# Patient Record
Sex: Female | Born: 1965
Health system: Southern US, Community
[De-identification: ages and names within clinical notes are randomized; demographics above are authoritative.]

## PROBLEM LIST (undated history)

## (undated) DIAGNOSIS — J45909 Unspecified asthma, uncomplicated: Secondary | ICD-10-CM

## (undated) DIAGNOSIS — I4892 Unspecified atrial flutter: Secondary | ICD-10-CM

## (undated) HISTORY — PX: ESOPHAGOGASTRODUODENOSCOPY: SHX1529

## (undated) HISTORY — PX: MOUTH SURGERY: SHX715

## (undated) HISTORY — PX: COLONOSCOPY: SHX174

## (undated) HISTORY — DX: Unspecified atrial flutter: I48.92

## (undated) HISTORY — DX: Unspecified asthma, uncomplicated: J45.909

---

## 2000-04-04 ENCOUNTER — Other Ambulatory Visit: Admission: RE | Admit: 2000-04-04 | Discharge: 2000-04-04 | Payer: Self-pay | Admitting: Obstetrics and Gynecology

## 2001-04-18 ENCOUNTER — Ambulatory Visit (HOSPITAL_COMMUNITY): Admission: RE | Admit: 2001-04-18 | Discharge: 2001-04-18 | Payer: Self-pay | Admitting: Internal Medicine

## 2004-03-03 ENCOUNTER — Other Ambulatory Visit: Admission: RE | Admit: 2004-03-03 | Discharge: 2004-03-03 | Payer: Self-pay | Admitting: Obstetrics and Gynecology

## 2004-08-31 ENCOUNTER — Inpatient Hospital Stay (HOSPITAL_COMMUNITY): Admission: AD | Admit: 2004-08-31 | Discharge: 2004-09-04 | Payer: Self-pay | Admitting: Obstetrics and Gynecology

## 2004-10-08 ENCOUNTER — Other Ambulatory Visit: Admission: RE | Admit: 2004-10-08 | Discharge: 2004-10-08 | Payer: Self-pay | Admitting: Obstetrics and Gynecology

## 2004-10-09 ENCOUNTER — Encounter: Admission: RE | Admit: 2004-10-09 | Discharge: 2004-11-08 | Payer: Self-pay | Admitting: Obstetrics and Gynecology

## 2004-11-09 ENCOUNTER — Encounter: Admission: RE | Admit: 2004-11-09 | Discharge: 2004-12-09 | Payer: Self-pay | Admitting: Obstetrics and Gynecology

## 2005-01-09 ENCOUNTER — Encounter: Admission: RE | Admit: 2005-01-09 | Discharge: 2005-02-08 | Payer: Self-pay | Admitting: Obstetrics and Gynecology

## 2005-02-09 ENCOUNTER — Encounter: Admission: RE | Admit: 2005-02-09 | Discharge: 2005-03-10 | Payer: Self-pay | Admitting: Obstetrics and Gynecology

## 2005-03-11 ENCOUNTER — Encounter: Admission: RE | Admit: 2005-03-11 | Discharge: 2005-04-10 | Payer: Self-pay | Admitting: Obstetrics and Gynecology

## 2005-04-11 ENCOUNTER — Encounter: Admission: RE | Admit: 2005-04-11 | Discharge: 2005-05-10 | Payer: Self-pay | Admitting: Obstetrics and Gynecology

## 2005-05-11 ENCOUNTER — Encounter: Admission: RE | Admit: 2005-05-11 | Discharge: 2005-06-10 | Payer: Self-pay | Admitting: Obstetrics and Gynecology

## 2005-06-11 ENCOUNTER — Encounter: Admission: RE | Admit: 2005-06-11 | Discharge: 2005-07-11 | Payer: Self-pay | Admitting: Obstetrics and Gynecology

## 2005-07-12 ENCOUNTER — Encounter: Admission: RE | Admit: 2005-07-12 | Discharge: 2005-08-08 | Payer: Self-pay | Admitting: Obstetrics and Gynecology

## 2005-08-09 ENCOUNTER — Encounter: Admission: RE | Admit: 2005-08-09 | Discharge: 2005-09-08 | Payer: Self-pay | Admitting: Obstetrics and Gynecology

## 2005-09-09 ENCOUNTER — Encounter: Admission: RE | Admit: 2005-09-09 | Discharge: 2005-10-08 | Payer: Self-pay | Admitting: Obstetrics and Gynecology

## 2005-10-09 ENCOUNTER — Encounter: Admission: RE | Admit: 2005-10-09 | Discharge: 2005-11-08 | Payer: Self-pay | Admitting: Obstetrics and Gynecology

## 2005-11-09 ENCOUNTER — Encounter: Admission: RE | Admit: 2005-11-09 | Discharge: 2005-11-27 | Payer: Self-pay | Admitting: Obstetrics and Gynecology

## 2013-03-01 ENCOUNTER — Other Ambulatory Visit: Payer: Self-pay

## 2013-04-11 ENCOUNTER — Other Ambulatory Visit: Payer: Self-pay

## 2013-04-11 DIAGNOSIS — Z1231 Encounter for screening mammogram for malignant neoplasm of breast: Secondary | ICD-10-CM

## 2013-04-11 DIAGNOSIS — Z803 Family history of malignant neoplasm of breast: Secondary | ICD-10-CM

## 2013-05-03 ENCOUNTER — Other Ambulatory Visit (HOSPITAL_COMMUNITY)
Admission: RE | Admit: 2013-05-03 | Discharge: 2013-05-03 | Disposition: A | Discharge status: Home / Self Care | Payer: BC Managed Care – PPO | Source: Ambulatory Visit | Attending: Family Medicine | Admitting: Family Medicine

## 2013-05-03 ENCOUNTER — Other Ambulatory Visit: Payer: Self-pay | Admitting: Family Medicine

## 2013-05-03 DIAGNOSIS — Z1151 Encounter for screening for human papillomavirus (HPV): Secondary | ICD-10-CM | POA: Insufficient documentation

## 2013-05-03 DIAGNOSIS — Z124 Encounter for screening for malignant neoplasm of cervix: Secondary | ICD-10-CM | POA: Insufficient documentation

## 2013-05-16 ENCOUNTER — Ambulatory Visit
Admission: RE | Admit: 2013-05-16 | Discharge: 2013-05-16 | Disposition: A | Discharge status: Home / Self Care | Payer: BC Managed Care – PPO | Source: Ambulatory Visit

## 2013-05-16 DIAGNOSIS — Z803 Family history of malignant neoplasm of breast: Secondary | ICD-10-CM

## 2013-05-16 DIAGNOSIS — Z1231 Encounter for screening mammogram for malignant neoplasm of breast: Secondary | ICD-10-CM

## 2014-09-20 ENCOUNTER — Other Ambulatory Visit: Payer: Self-pay

## 2014-09-20 DIAGNOSIS — Z1231 Encounter for screening mammogram for malignant neoplasm of breast: Secondary | ICD-10-CM

## 2014-09-27 ENCOUNTER — Ambulatory Visit: Payer: Self-pay

## 2014-10-02 ENCOUNTER — Ambulatory Visit
Admission: RE | Admit: 2014-10-02 | Discharge: 2014-10-02 | Disposition: A | Discharge status: Home / Self Care | Payer: BLUE CROSS/BLUE SHIELD | Source: Ambulatory Visit

## 2014-10-02 DIAGNOSIS — Z1231 Encounter for screening mammogram for malignant neoplasm of breast: Secondary | ICD-10-CM

## 2015-12-18 MED FILL — FLUoxetine HCL 40 MG CAPS: 40 | 90 days supply | Qty: 90 | Fill #0

## 2016-03-31 DIAGNOSIS — Z0120 Encounter for dental examination and cleaning without abnormal findings: Secondary | ICD-10-CM | POA: Diagnosis not present

## 2016-05-14 DIAGNOSIS — F329 Major depressive disorder, single episode, unspecified: Secondary | ICD-10-CM | POA: Diagnosis not present

## 2016-05-14 MED FILL — FLUoxetine HCL 40 MG CAPS: 40 | 90 days supply | Qty: 90 | Fill #0

## 2016-05-20 DIAGNOSIS — M545 Low back pain: Secondary | ICD-10-CM | POA: Diagnosis not present

## 2016-05-20 DIAGNOSIS — G8929 Other chronic pain: Secondary | ICD-10-CM | POA: Diagnosis not present

## 2016-07-22 DIAGNOSIS — J069 Acute upper respiratory infection, unspecified: Secondary | ICD-10-CM | POA: Diagnosis not present

## 2016-07-22 MED FILL — AZITHROMYCIN 250 MG TABLET: 250 | 5 days supply | Qty: 6 | Fill #0

## 2016-07-22 MED FILL — PROMETHAZINE-CODEINE SYRUP: 6.25-10 | 6 days supply | Qty: 120 | Fill #0

## 2016-11-11 MED FILL — FLUoxetine HCL 40 MG CAPS: 40 | 90 days supply | Qty: 90 | Fill #1

## 2017-11-11 DIAGNOSIS — Z0000 Encounter for general adult medical examination without abnormal findings: Secondary | ICD-10-CM | POA: Diagnosis not present

## 2017-11-15 DIAGNOSIS — F329 Major depressive disorder, single episode, unspecified: Secondary | ICD-10-CM | POA: Diagnosis not present

## 2017-11-15 DIAGNOSIS — Z0000 Encounter for general adult medical examination without abnormal findings: Secondary | ICD-10-CM | POA: Diagnosis not present

## 2017-11-15 MED FILL — FLUoxetine HCL 40 MG CAPS: 40 | 90 days supply | Qty: 90 | Fill #0

## 2018-02-27 MED FILL — FLUoxetine HCL 40 MG CAPS: 40 | 90 days supply | Qty: 90 | Fill #1

## 2018-07-20 MED FILL — FLUoxetine HCL 40 MG CAPS: 40 | 90 days supply | Qty: 90 | Fill #0

## 2018-08-10 DIAGNOSIS — H524 Presbyopia: Secondary | ICD-10-CM | POA: Diagnosis not present

## 2018-11-21 DIAGNOSIS — Z1211 Encounter for screening for malignant neoplasm of colon: Secondary | ICD-10-CM | POA: Diagnosis not present

## 2018-11-21 DIAGNOSIS — F329 Major depressive disorder, single episode, unspecified: Secondary | ICD-10-CM | POA: Diagnosis not present

## 2018-11-21 DIAGNOSIS — Z0000 Encounter for general adult medical examination without abnormal findings: Secondary | ICD-10-CM | POA: Diagnosis not present

## 2018-11-21 MED FILL — FLUoxetine HCL 40 MG CAPS: 40 | 90 days supply | Qty: 90 | Fill #0

## 2018-11-23 MED FILL — ALBUTEROL SULFATE HFA 108 (: 108 (90 BAS | 33 days supply | Qty: 18 | Fill #0

## 2019-01-22 DIAGNOSIS — Z1211 Encounter for screening for malignant neoplasm of colon: Secondary | ICD-10-CM | POA: Diagnosis not present

## 2019-03-21 ENCOUNTER — Other Ambulatory Visit: Payer: Self-pay | Admitting: Family Medicine

## 2019-03-21 DIAGNOSIS — Z1231 Encounter for screening mammogram for malignant neoplasm of breast: Secondary | ICD-10-CM

## 2019-03-28 ENCOUNTER — Other Ambulatory Visit: Payer: Self-pay

## 2019-03-28 ENCOUNTER — Ambulatory Visit (INDEPENDENT_AMBULATORY_CARE_PROVIDER_SITE_OTHER): Payer: 59

## 2019-03-28 DIAGNOSIS — Z1231 Encounter for screening mammogram for malignant neoplasm of breast: Secondary | ICD-10-CM

## 2019-04-02 ENCOUNTER — Telehealth: Payer: 59 | Admitting: Physician Assistant

## 2019-04-02 DIAGNOSIS — R059 Cough, unspecified: Secondary | ICD-10-CM

## 2019-04-02 DIAGNOSIS — R05 Cough: Secondary | ICD-10-CM

## 2019-04-02 DIAGNOSIS — R062 Wheezing: Secondary | ICD-10-CM

## 2019-04-02 NOTE — Progress Notes (Signed)
Hi Kathy Ramsey, I am sorry you are not feeling well.  I am concerned about the progression of your wheezing.  I would feel more comfortable if you were seen by a medical provider so they can listen to you breathing and check a chest x-ray if necessary.    Based on what you shared with me, I feel your condition warrants further evaluation and I recommend that you be seen for a face to face office visit.  NOTE: If you entered your credit card information for this eVisit, you will not be charged. You may see a "hold" on your card for the $35 but that hold will drop off and you will not have a charge processed.  If you are having a true medical emergency please call 911.     For an urgent face to face visit, [facility] has four urgent care centers for your convenience:    NEW:  [facility] [address], [place] [address] .  Monday - Friday 10 am - 6 pm    . [facility]    [phone]                  Get Driving Directions  [id number] [place], [place] [address] . 10 am to 8 pm Monday-Friday . 12 pm to 8 pm Saturday-Sunday   . [facility] at [place]                  Get Driving Directions  [id number] [place], [place], [place] [address] . 8 am to 8 pm Monday-Friday . 9 am to 6 pm Saturday . 11 am to 6 pm Sunday     . [facility] at [place]                  Get Driving Directions   [address], [place] [address] . 8 am to 8 pm Monday-Friday . 8 am to 4 pm Saturday-Sunday    . [facility] at [place]                    Get Driving Directions  [id number]  [address], [place], [place] [address]  . Monday-Friday, 12 PM to 6 PM    Your e-visit answers were reviewed by a board certified advanced clinical practitioner to complete your personal care plan.  Thank you  for using e-Visits.

## 2019-04-13 MED FILL — ALBUTEROL SULFATE HFA 108 (: 108 (90 BAS | 25 days supply | Qty: 18 | Fill #0

## 2019-04-13 MED FILL — METHYLPREDNISOLONE 4 MG TBP: 4 | 6 days supply | Qty: 21 | Fill #0

## 2019-04-13 MED FILL — MONTELUKAST SOD 10 MG TAB: 10 | 30 days supply | Qty: 30 | Fill #0

## 2019-04-13 MED FILL — ADVAIR 250/50 DISKUS: 250-50 | 30 days supply | Qty: 60 | Fill #0

## 2019-04-18 DIAGNOSIS — R05 Cough: Secondary | ICD-10-CM | POA: Diagnosis not present

## 2019-04-18 DIAGNOSIS — M940 Chondrocostal junction syndrome [Tietze]: Secondary | ICD-10-CM | POA: Diagnosis not present

## 2019-04-19 DIAGNOSIS — R05 Cough: Secondary | ICD-10-CM | POA: Diagnosis not present

## 2019-04-20 ENCOUNTER — Ambulatory Visit (INDEPENDENT_AMBULATORY_CARE_PROVIDER_SITE_OTHER): Payer: 59 | Admitting: Internal Medicine

## 2019-04-20 ENCOUNTER — Other Ambulatory Visit: Payer: Self-pay

## 2019-04-20 ENCOUNTER — Encounter: Payer: Self-pay | Admitting: Internal Medicine

## 2019-04-20 VITALS — BP 110/68 | HR 72 | Temp 98.1°F | Ht 68.0 in | Wt 142.2 lb

## 2019-04-20 DIAGNOSIS — J4540 Moderate persistent asthma, uncomplicated: Secondary | ICD-10-CM

## 2019-04-20 MED ORDER — ALBUTEROL SULFATE HFA 108 (90 BASE) MCG/ACT IN AERS: 1.0000 | INHALATION_SPRAY | RESPIRATORY_TRACT | 11 refills | Status: DC | PRN

## 2019-04-20 MED ORDER — FLUTICASONE-SALMETEROL 250-50 MCG/DOSE IN AEPB: 1.0000 | INHALATION_SPRAY | Freq: Two times a day (BID) | RESPIRATORY_TRACT | 11 refills | Status: DC

## 2019-04-20 NOTE — Patient Instructions (Addendum)
The patient should have follow up scheduled with myself in 2 months.   Prior to next visit patient should have a full set of PFTs including - next available at [employer].   Spirometry with bronchodilator if obstructed Lung Volumes DLCO FeNO   Ok to take montelukast.  Start taking advair 2 puffs twice a day.   By learning about asthma and how it can be controlled, you take an important step toward managing this disease. Work closely with your asthma care team to learn all you can about your asthma, how to avoid triggers, what your medications do, and how to take them correctly. With proper care, you can live free of asthma symptoms and maintain a normal, healthy lifestyle.   What is asthma? Asthma is a chronic disease that affects the airways of the lungs. During normal breathing, the bands of muscle that surround the airways are relaxed and air moves freely. During an asthma episode or "attack," there are three main changes that stop air from moving easily through the airways:  The bands of muscle that surround the airways tighten and make the airways narrow. This tightening is called bronchospasm.   The lining of the airways becomes swollen or inflamed.   The cells that line the airways produce more mucus, which is thicker than normal and clogs the airways.  These three factors - bronchospasm, inflammation, and mucus production - cause symptoms such as difficulty breathing, wheezing, and coughing.  What are the most common symptoms of asthma? Asthma symptoms are not the same for everyone. They can even change from episode to episode in the same person. Also, you may have only one symptom of asthma, such as cough, but another person may have all the symptoms of asthma. It is important to know all the symptoms of asthma and to be aware that your asthma can present in any of these ways at any time. The most common symptoms include: . Coughing, especially at night  . Shortness of breath   . Wheezing  . Chest tightness, pain, or pressure   Who is affected by asthma? Asthma affects 22 million Americans; about 6 million of these are children under age 50. People who have a family history of asthma have an increased risk of developing the disease. Asthma is also more common in people who have allergies or who are exposed to tobacco smoke. However, anyone can develop asthma at any time. Some people may have asthma all of their lives, while others may develop it as adults.  What causes asthma? The airways in a person with asthma are very sensitive and react to many things, or "triggers." Contact with these triggers causes asthma symptoms. One of the most important parts of asthma control is to identify your triggers and then avoid them when possible. The only trigger you do not want to avoid is exercise. Pre-treatment with medicines before exercise can allow you to stay active yet avoid asthma symptoms. Common asthma triggers include: 1. Infections (colds, viruses, flu, sinus infections)  2. Exercise  3. Weather (changes in temperature and/or humidity, cold air)  4. Tobacco smoke  5. Allergens (dust mites, pollens, pets, mold spores, cockroaches, and sometimes foods)  6. Irritants (strong odors from cleaning products, perfume, wood smoke, air pollution)  7. Strong emotions such as crying or laughing hard  8. Some medications   How is asthma diagnosed? To diagnose asthma, your doctor will first review your medical history, family history, and symptoms. Your doctor will want to  know any past history of breathing problems you may have had, as well as a family history of asthma, allergies, eczema (a bumpy, itchy skin rash caused by allergies), or other lung disease. It is important that you describe your symptoms in detail (cough, wheeze, shortness of breath, chest tightness), including when and how often they occur. The doctor will perform a physical examination and listen to your heart  and lungs. He or she may also order breathing tests, allergy tests, blood tests, and chest and sinus X-rays. The tests will find out if you do have asthma and if there are any other conditions that are contributing factors.  How is asthma treated? Asthma can be controlled, but not cured. It is not normal to have frequent symptoms, trouble sleeping, or trouble completing tasks. Appropriate asthma care will prevent symptoms and visits to the emergency room and hospital. Asthma medicines are one of the mainstays of asthma treatment. The drugs used to treat asthma are explained below.  Anti-inflammatories: These are the most important drugs for most people with asthma. Anti-inflammatory drugs reduce swelling and mucus production in the airways. As a result, airways are less sensitive and less likely to react to triggers. These medications need to be taken daily and may need to be taken for several weeks before they begin to control asthma. Anti-inflammatory medicines lead to fewer symptoms, better airflow, less sensitive airways, less airway damage, and fewer asthma attacks. If taken every day, they CONTROL or prevent asthma symptoms.   Bronchodilators: These drugs relax the muscle bands that tighten around the airways. This action opens the airways, letting more air in and out of the lungs and improving breathing. Bronchodilators also help clear mucus from the lungs. As the airways open, the mucus moves more freely and can be coughed out more easily. In short-acting forms, bronchodilators RELIEVE or stop asthma symptoms by quickly opening the airways and are very helpful during an asthma episode. In long-acting forms, bronchodilators provide CONTROL of asthma symptoms and prevent asthma episodes.  Asthma drugs can be taken in a variety of ways. Inhaling the medications by using a metered dose inhaler, dry powder inhaler, or nebulizer is one way of taking asthma medicines. Oral medicines (pills or liquids you  swallow) may also be prescribed.  Asthma severity Asthma is classified as either "intermittent" (comes and goes) or "persistent" (lasting). Persistent asthma is further described as being mild, moderate, or severe. The severity of asthma is based on how often you have symptoms both during the day and night, as well as by the results of lung function tests and by how well you can perform activities. The "severity" of asthma refers to how "intense" or "strong" your asthma is.  Asthma control Asthma control is the goal of asthma treatment. Regardless of your asthma severity, it may or may not be controlled. Asthma control means: . You are able to do everything you want to do at work and home  . You have no (or minimal) asthma symptoms  . You do not wake up from your sleep or earlier than usual in the morning due to asthma  . You rarely need to use your reliever medicine (inhaler)  Another major part of your treatment is that you are happy with your asthma care and believe your asthma is controlled.  Monitoring symptoms A key part of treatment is keeping track of how well your lungs are working. Monitoring your symptoms  what they are, how and when they happen, and how  severe they are  is an important part of being able to control your asthma.  Sometimes asthma is monitored using a peak flow meter. A peak flow (PF) meter measures how fast the air comes out of your lungs. It can help you know when your asthma is getting worse, sometimes even before you have symptoms. By taking daily peak flow readings, you can learn when to adjust medications to keep asthma under good control. It is also used to create your asthma action plan (see below). Your doctor can use your peak flow readings to adjust your treatment plan in some cases.  Asthma Action Plan Based on your history and asthma severity, you and your doctor will develop a care plan called an "asthma action plan." The asthma action plan describes  when and how to use your medicines, actions to take when asthma worsens, and when to seek emergency care. Make sure you understand this plan. If you do not, ask your asthma care provider any questions you may have. Your asthma action plan is one of the keys to controlling asthma. Keep it readily available to remind you of what you need to do every day to control asthma and what you need to do when symptoms occur.  Goals of asthma therapy These are the goals of asthma treatment: . Live an active, normal life  . Prevent chronic and troublesome symptoms  . Attend work or school every day  . Perform daily activities without difficulty  . Stop urgent visits to the doctor, emergency department, or hospital  . Use and adjust medications to control asthma with few or no side effects

## 2019-04-20 NOTE — Progress Notes (Signed)
Kathy Ramsey    [id number]    07/25/1965  Primary Care Physician:Ramsey, Kathy Mantis  Referring Physician: Marda Stalker, PA-C [address] [place],  [place] [address] Reason for Consultation: cough Date of Consultation: 04/20/2019  Chief complaint:   Chief Complaint  Patient presents with  . Consult    symptoms, cough, wheeze, sometimes whitstle for slight symptoms started 60months ago worsening June/July 2020     HPI: About a year ago started having a light cough and tickle. Has gotten worse over the last year, usually non productive. Started taking allergy medicine - OTC anti-histamine and did not improve much.  She does feel like she has drainage. Has had wheezing relieved with a rescue inhaler and reduces coughing. Cough is worse at night when she is laying down, and with laughing and talking too much. Deep breathing also provokes the cough.    History of pneumonia twice - she had H1N1. She had allergies as a child and wonders if she had asthma vs panic attacks as a child.   She had a recent telehealth visit and was told she has asthma and gave her prednisone and a couple of inhalers but she hasn't started yet.  Current Regimen: albuterol prn Asthma Triggers: weather changes Exacerbations in the last year: History of hospitalization or intubation: Hives: none.  Allergy Testing: as a child - molds and mildew. Was on allergy shots.  GERD: denies Allergic Rhinitis: +post nasal drip.  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 Smoking history: former smoker,  Lives at home with her husband and 23 yo daughter. Dog and cat  Has lived in [place] since [place] History   Occupational History  . Not on file  Tobacco Use  . Smoking status: Former Smoker    Packs/day: 2.50    Years: 22.00    Pack years: 55.00    Types: Cigarettes     Start date: 7    Quit date: 2005    Years since quitting: 15.8  . Smokeless tobacco: Never Used  Substance and Sexual Activity  . Alcohol use: Yes    Alcohol/week: 6.0 standard drinks    Types: 6 Cans of beer per week  . Drug use: Not Currently    Types: Marijuana  . Sexual activity: Not on file    Relevant family history:  Family History  Problem Relation Age of Onset  . Asthma Mother   . COPD Mother   . Leukemia Mother   . Heart failure Mother   . CAD Father   . Heart failure Father   . Asthma Child     History reviewed. No pertinent past medical history.  History reviewed. No pertinent surgical history.   Review of systems: Review of Systems  Constitutional: Negative for chills and fever.  HENT: Positive for sore throat. Negative for congestion and sinus pain.   Eyes: Negative for discharge and redness.  Respiratory: Positive for cough, shortness of breath and wheezing. Negative for hemoptysis.   Cardiovascular: Negative for chest pain and palpitations.  Gastrointestinal: Negative for heartburn, nausea and vomiting.  Genitourinary: Negative.   Musculoskeletal: Negative for myalgias.  Skin: Negative for rash.  Neurological: Negative for focal weakness.  Endo/Heme/Allergies: Positive for environmental allergies.  Psychiatric/Behavioral: The patient is not nervous/anxious.   All other systems reviewed and are negative.  Physical Exam: Blood pressure 110/68, pulse 72, temperature 98.1 F (36.7 C), temperature source Temporal, height 5\' 8"  (1.727 m), weight 142 lb 3.2 oz (64.5 kg), SpO2 97 %. Gen:      No acute distress Eyes: EOMI, sclera anicteric ENT:  no nasal polyps, mucus membranes moist, no nasal erythema Neck:     Supple, no thyromegaly Lungs:    No increased respiratory effort, symmetric chest wall excursion, clear to auscultation bilaterally, no wheezes or crackles, follow inspiration CV:         Regular rate and rhythm; no murmurs, rubs, or  gallops.  No pedal edema Abd:      + bowel sounds; soft, non-tender; no distension MSK: no acute synovitis of DIP or PIP joints, no mechanics hands.  Skin:      Warm and dry; no rashes Neuro: normal speech, no focal facial asymmetry Psych: alert and oriented x3, normal mood and affect  Data Reviewed: Imaging: No imaging on file related to lung parenchyma  PFTs:  No PFTs on file.  Labs:  Immunization status: Immunization History  Administered Date(s) Administered  . Influenza Whole 03/26/2019   up to date and documented.  Assessment:  Kathy Ramsey is a 53 year old woman who presents with worsening cough, shortness of breath, wheezing concerning for new onset asthma. COPD is also in the differential, as is asthma COPD overlap syndrome. Did not appear to have much of a chronic bronchitis phenotype as her cough is mostly dry.  Moderate persistent asthma -diagnosed today Allergic rhinitis History of tobacco use, quit 15 years ago  Plan/Recommendations: Today we will start Advair discus 2 puffs twice daily She can continue albuterol as needed I also suggest she can start taking her Singulair to help with the drainage We will order a full set of pulmonary function testing including exhaled NO which she can get done at next available  Return to Care: Return in about 8 weeks (around 06/15/2019) for asthma, with ND.  Kathy Llamas, MD [facility] [place]  CC: Kathy Ramsey, [place]

## 2019-07-03 ENCOUNTER — Ambulatory Visit: Payer: 59 | Attending: Internal Medicine

## 2019-07-03 DIAGNOSIS — Z20822 Contact with and (suspected) exposure to covid-19: Secondary | ICD-10-CM | POA: Diagnosis not present

## 2019-07-04 LAB — NOVEL CORONAVIRUS, NAA: SARS-CoV-2, NAA: NOT DETECTED

## 2019-08-03 MED FILL — FLUoxetine HCL 40 MG CAPS: 40 | 90 days supply | Qty: 90 | Fill #1

## 2019-10-18 MED FILL — ADVAIR 250/50 DISKUS: 250-50 | 30 days supply | Qty: 60 | Fill #0

## 2019-10-18 MED FILL — ALBUTEROL SULFATE HFA 108 (: 108 (90 BAS | 16 days supply | Qty: 18 | Fill #0

## 2019-11-12 MED FILL — ADVAIR 250/50 DISKUS: 250-50 | 30 days supply | Qty: 60 | Fill #1

## 2020-01-04 DIAGNOSIS — Z23 Encounter for immunization: Secondary | ICD-10-CM | POA: Diagnosis not present

## 2020-01-04 DIAGNOSIS — Z1211 Encounter for screening for malignant neoplasm of colon: Secondary | ICD-10-CM | POA: Diagnosis not present

## 2020-01-04 DIAGNOSIS — F329 Major depressive disorder, single episode, unspecified: Secondary | ICD-10-CM | POA: Diagnosis not present

## 2020-01-04 DIAGNOSIS — Z0000 Encounter for general adult medical examination without abnormal findings: Secondary | ICD-10-CM | POA: Diagnosis not present

## 2020-01-04 MED FILL — BUPROPION HCL SR 100 MG TAB: 100 | 90 days supply | Qty: 90 | Fill #0

## 2020-01-19 MED FILL — ALBUTEROL SULFATE HFA 108 (: 108 (90 BAS | 16 days supply | Qty: 18 | Fill #2

## 2020-04-14 DIAGNOSIS — H524 Presbyopia: Secondary | ICD-10-CM | POA: Diagnosis not present

## 2020-04-30 ENCOUNTER — Other Ambulatory Visit (HOSPITAL_COMMUNITY): Payer: Self-pay | Admitting: Family Medicine

## 2020-04-30 MED FILL — BUPROPION HCL SR 100 MG TAB: 100 | 90 days supply | Qty: 90 | Fill #0

## 2020-04-30 MED FILL — FLUoxetine HCL 40 MG CAPS: 40 | 90 days supply | Qty: 90 | Fill #0

## 2020-05-12 ENCOUNTER — Other Ambulatory Visit: Payer: Self-pay | Admitting: Internal Medicine

## 2020-05-12 NOTE — Telephone Encounter (Signed)
Patient is requesting refills and has not seen Dr. Shearon Stalls for over a year with no follow up appt. I tried to call patient to make one to send refills and left VM. Will await patient call.

## 2020-05-12 NOTE — Telephone Encounter (Signed)
Patient has not seen Dr. Shearon Stalls in over 1 year and no follow appt has been made. I called and left VM for patient to call to schedule an appt so we can send in refills. Will await phone call.

## 2020-05-13 MED FILL — ALBUTEROL SULFATE HFA 108 (: 108 (90 BAS | 16 days supply | Qty: 18 | Fill #0

## 2020-05-13 MED FILL — ADVAIR 250/50 DISKUS: 250-50 | 30 days supply | Qty: 60 | Fill #0

## 2020-05-13 NOTE — Telephone Encounter (Signed)
Dr. Shearon Stalls, please advise if you are okay with [place] refilling meds. Last seen by you 04/20/19

## 2020-05-13 NOTE — Telephone Encounter (Signed)
One month, no refills, needs appt.

## 2020-06-22 DIAGNOSIS — Z20828 Contact with and (suspected) exposure to other viral communicable diseases: Secondary | ICD-10-CM | POA: Diagnosis not present

## 2020-08-23 IMAGING — MG DIGITAL SCREENING BILAT W/ TOMO
8 series · 9 of 24 positions shown · non-contrast
Comparison: Previous exam(s).

CLINICAL DATA: Screening.

EXAM:
DIGITAL SCREENING BILATERAL MAMMOGRAM WITH TOMO AND CAD

[L MLO synth-2D]
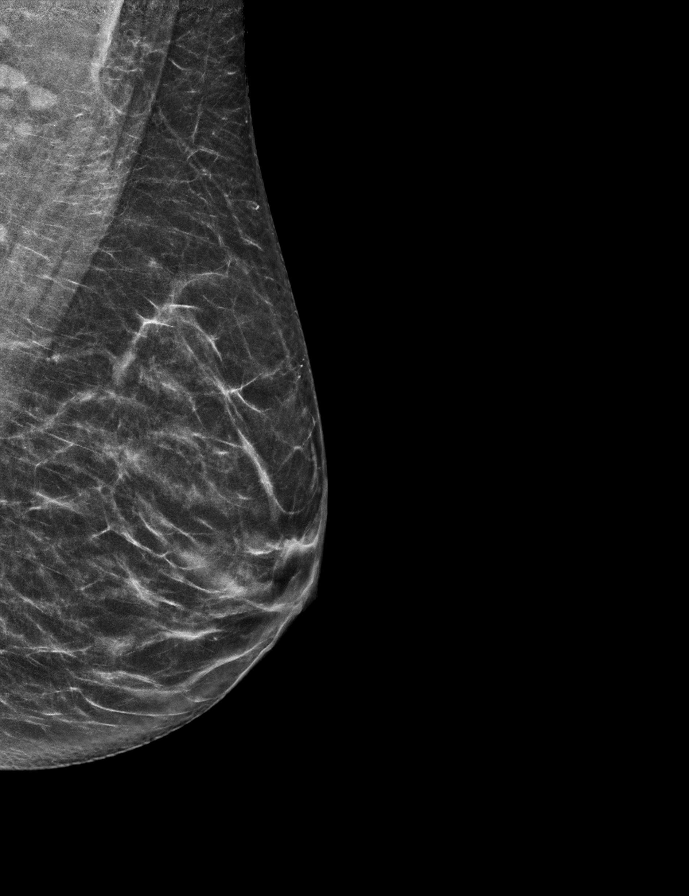

[R MLO synth-2D]
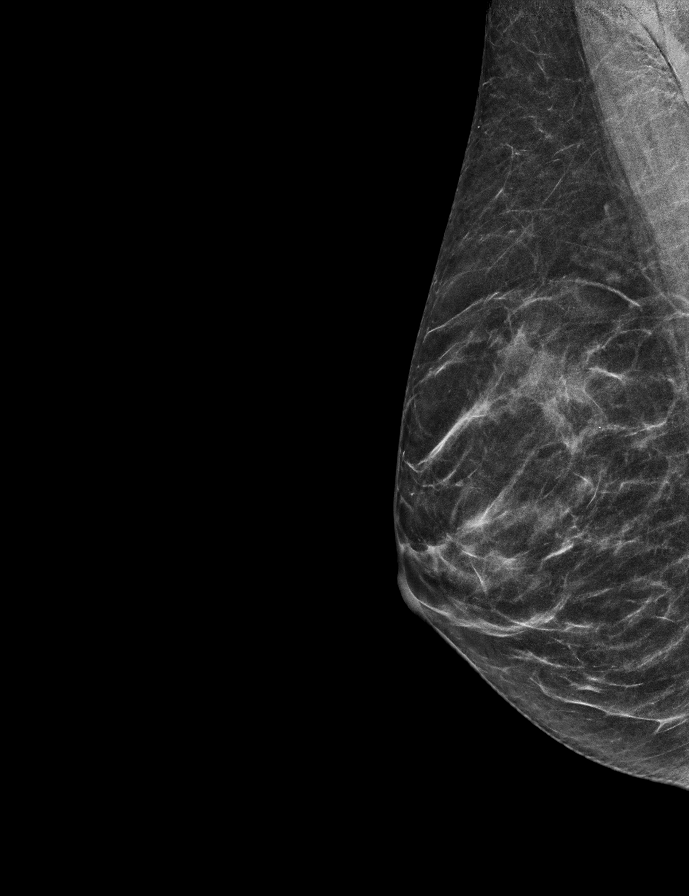

[L CC synth-2D]
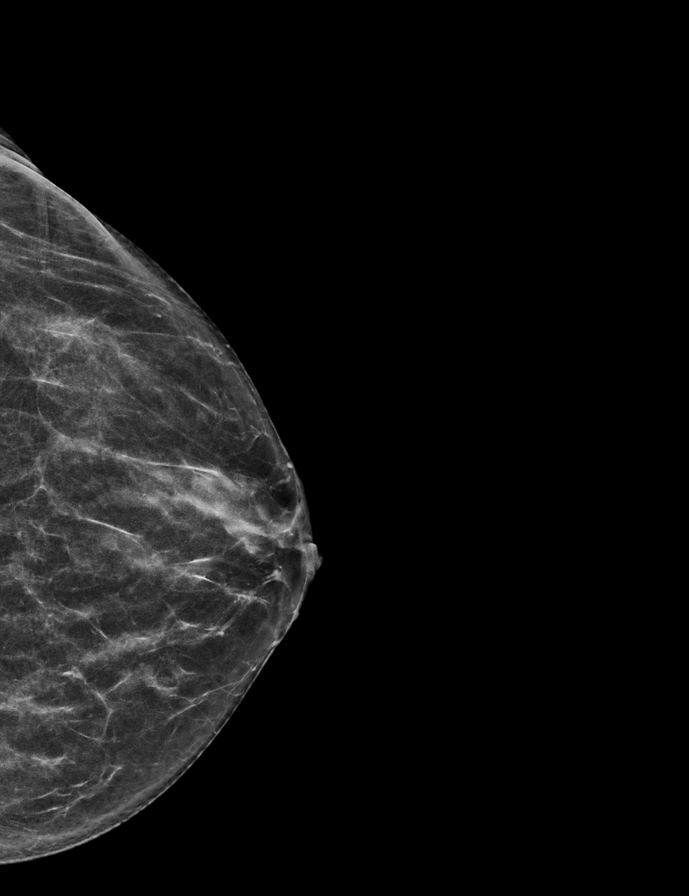

[R CC synth-2D]
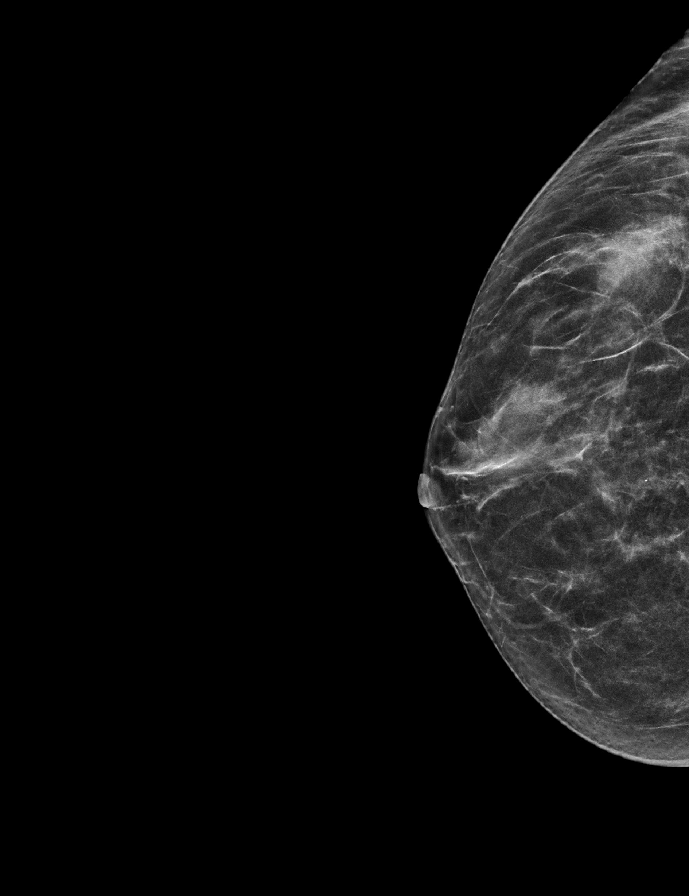

[R MLO tomo · 2 of 51 frames shown]
[frame 17/51]
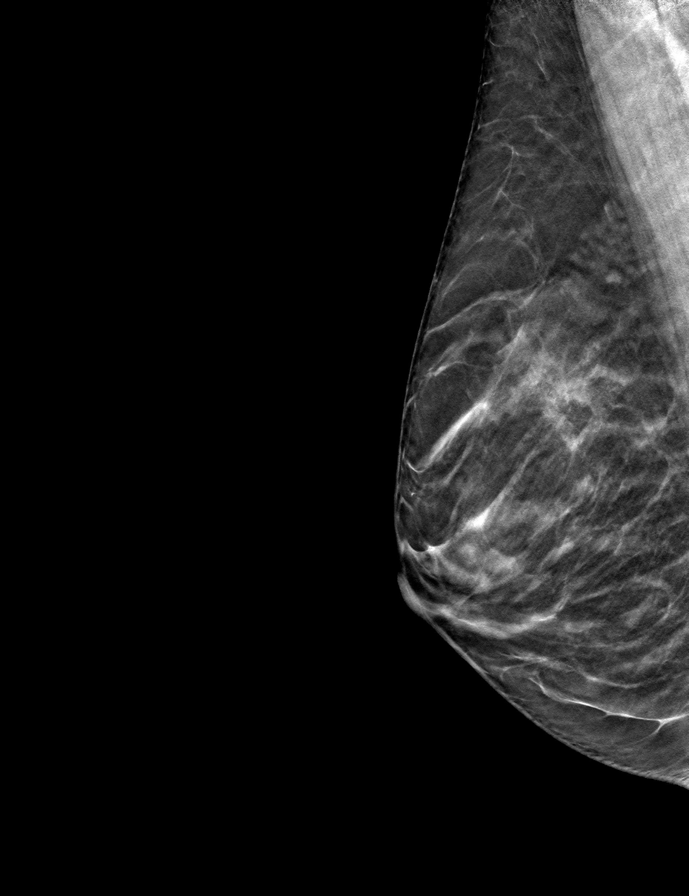
[frame 26/51]
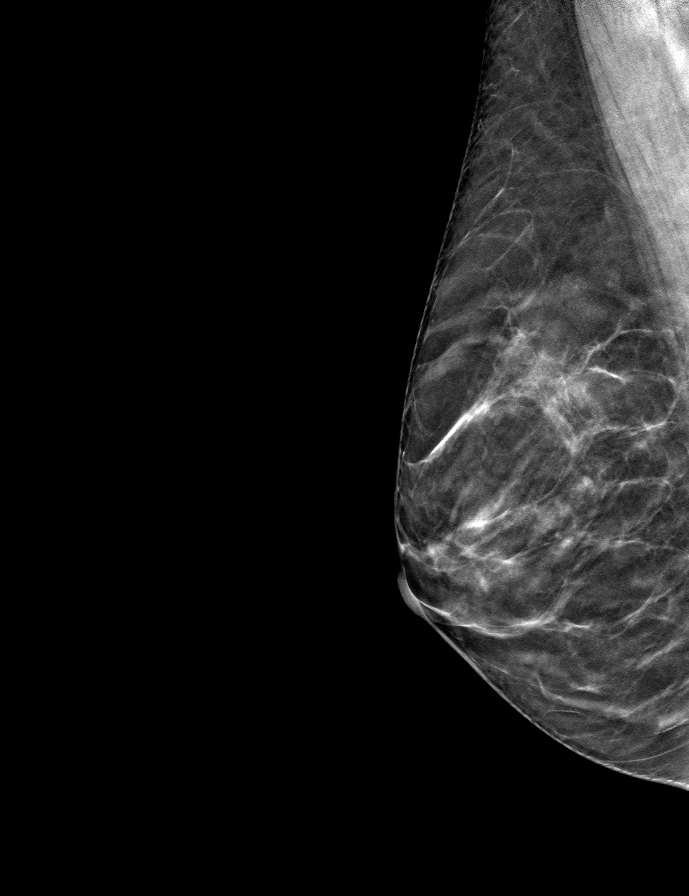

[R CC tomo · tomo slice 27/53.0]
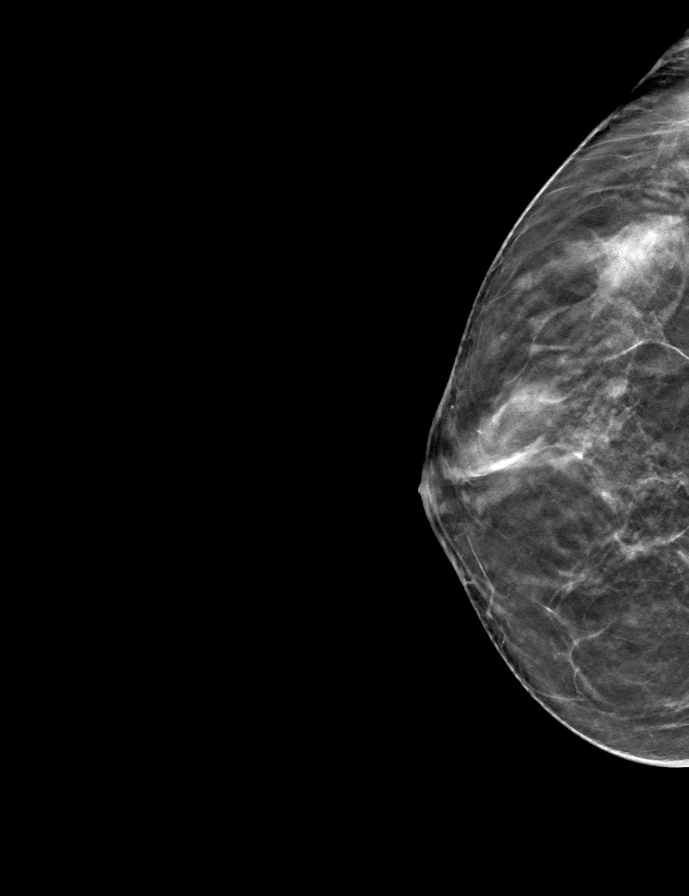

[L CC tomo · tomo slice 29/56.0]
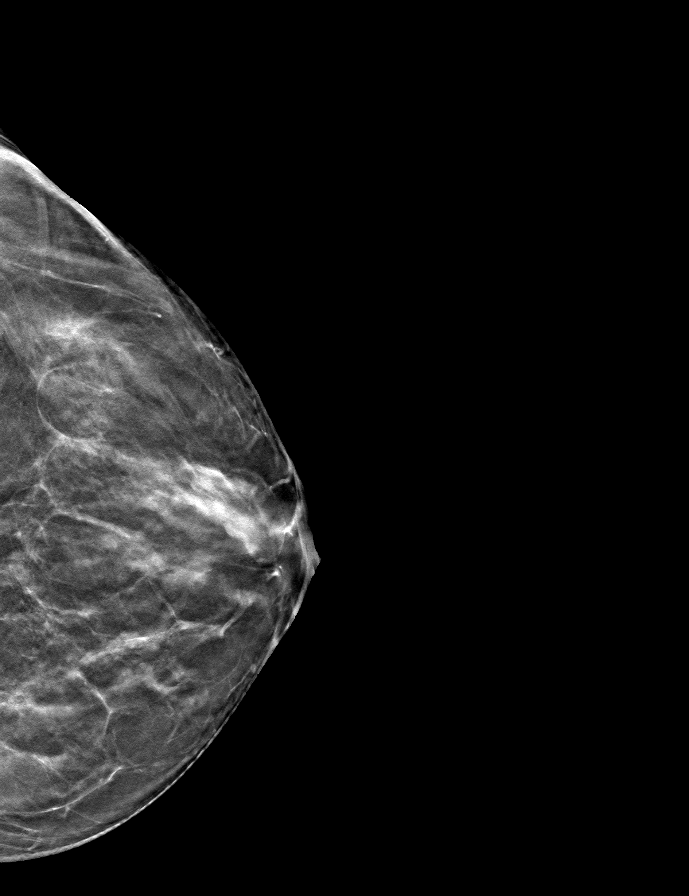

[L MLO tomo · tomo slice 29/57.0]
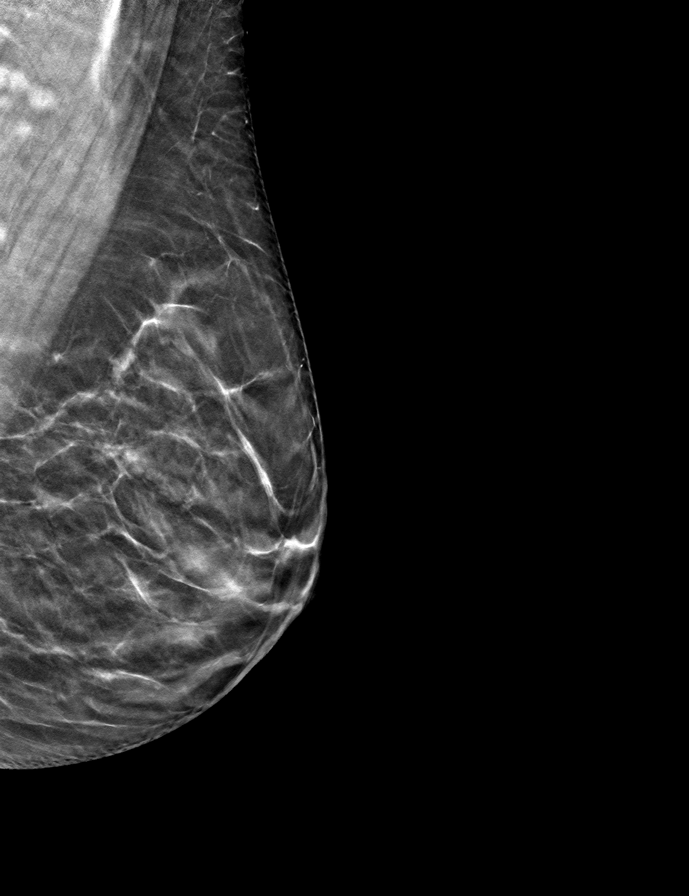

[9 of 24 positions shown; findings below may reference images not displayed]

ACR Breast Density Category b: There are scattered areas of
fibroglandular density.
FINDINGS: There are no findings suspicious for malignancy. Images were
processed with CAD.
IMPRESSION: No mammographic evidence of malignancy. A result letter of this
screening mammogram will be mailed directly to the patient.

RECOMMENDATION:
Screening mammogram in one year. (Code:[id number])

BI-RADS CATEGORY  1: Negative.

## 2020-09-21 ENCOUNTER — Other Ambulatory Visit: Payer: Self-pay

## 2020-09-27 ENCOUNTER — Other Ambulatory Visit (HOSPITAL_COMMUNITY): Payer: Self-pay

## 2020-09-27 MED FILL — Bupropion HCl Tab ER 12HR 100 MG: ORAL | 90 days supply | Qty: 90 | Fill #0 | Status: CN

## 2020-09-27 MED FILL — Fluoxetine HCl Cap 40 MG: ORAL | 90 days supply | Qty: 90 | Fill #0 | Status: CN

## 2020-09-29 ENCOUNTER — Other Ambulatory Visit (HOSPITAL_COMMUNITY): Payer: Self-pay

## 2020-09-29 MED ORDER — ALBUTEROL SULFATE HFA 108 (90 BASE) MCG/ACT IN AERS
INHALATION_SPRAY | RESPIRATORY_TRACT | 1 refills | Status: DC
  Filled 2020-09-29: qty 18, 16d supply, fill #0
  Filled 2020-11-15: qty 18, 17d supply, fill #0
  Filled 2021-04-02: qty 18, 17d supply, fill #1

## 2020-10-06 ENCOUNTER — Other Ambulatory Visit (HOSPITAL_COMMUNITY): Payer: Self-pay

## 2020-10-10 ENCOUNTER — Other Ambulatory Visit: Payer: Self-pay | Admitting: Family Medicine

## 2020-10-10 DIAGNOSIS — Z1231 Encounter for screening mammogram for malignant neoplasm of breast: Secondary | ICD-10-CM

## 2020-10-17 ENCOUNTER — Other Ambulatory Visit (HOSPITAL_COMMUNITY): Payer: Self-pay

## 2020-10-17 MED FILL — Bupropion HCl Tab ER 12HR 100 MG: ORAL | 90 days supply | Qty: 90 | Fill #0 | Status: CN

## 2020-10-17 MED FILL — Fluoxetine HCl Cap 40 MG: ORAL | 90 days supply | Qty: 90 | Fill #0 | Status: CN

## 2020-10-28 ENCOUNTER — Other Ambulatory Visit (HOSPITAL_COMMUNITY): Payer: Self-pay

## 2020-10-30 ENCOUNTER — Ambulatory Visit: Payer: 59

## 2020-11-15 ENCOUNTER — Other Ambulatory Visit (HOSPITAL_COMMUNITY): Payer: Self-pay

## 2020-11-15 MED FILL — Fluoxetine HCl Cap 40 MG: ORAL | 90 days supply | Qty: 90 | Fill #0 | Status: AC

## 2020-11-15 MED FILL — Bupropion HCl Tab ER 12HR 100 MG: ORAL | 90 days supply | Qty: 90 | Fill #0 | Status: AC

## 2020-11-17 ENCOUNTER — Other Ambulatory Visit (HOSPITAL_COMMUNITY): Payer: Self-pay

## 2020-11-17 MED ORDER — CARESTART COVID-19 HOME TEST VI KIT
PACK | 0 refills | Status: DC
  Filled 2020-11-17: qty 4, 4d supply, fill #0

## 2020-11-29 ENCOUNTER — Telehealth: Payer: 59 | Admitting: Nurse Practitioner

## 2020-11-29 DIAGNOSIS — K625 Hemorrhage of anus and rectum: Secondary | ICD-10-CM

## 2020-11-29 NOTE — Progress Notes (Addendum)
[name], Kathy Ramsey are scheduled for a virtual visit with your provider today.    Just as we do with appointments in the office, we must obtain your consent to participate.  Your consent will be active for this visit and any virtual visit you may have with one of our providers in the next 365 days.    If you have a MyChart account, I can also send a copy of this consent to you electronically.  All virtual visits are billed to your insurance company just like a traditional visit in the office.  As this is a virtual visit, video technology does not allow for your provider to perform a traditional examination.  This may limit your provider's ability to fully assess your condition.  If your provider identifies any concerns that need to be evaluated in person or the need to arrange testing such as labs, EKG, etc, we will make arrangements to do so.    Although advances in technology are sophisticated, we cannot ensure that it will always work on either your end or our end.  If the connection with a video visit is poor, we may have to switch to a telephone visit.  With either a video or telephone visit, we are not always able to ensure that we have a secure connection.   I need to obtain your verbal consent now.   Are you willing to proceed with your visit today?   Kathy Ramsey has provided verbal consent on 11/29/2020 for a virtual visit (video or telephone).   Apolonio Schneiders, FNP 11/29/2020  11:24 AM      Virtual Visit via Video   I connected with patient on 11/29/20 at 11:15 AM EDT by a video enabled telemedicine application and verified that I am speaking with the correct person using two identifiers.  Location patient: Home Location provider: [place] participating in the virtual visit: Patient, Provider  I discussed the limitations of evaluation and management by telemedicine and the availability of in person appointments. The patient expressed understanding and  agreed to proceed.  Subjective:   HPI:   Patient presents via [facility] today with complaints of blood that was noted during her am bowel movement. She denies any itching or pain.   She suffers from chronic diarrhea and has since she was a child.  She takes imodium occassionally for relief. She did take that yesterday for the first time in months.   She has had one colonoscopy in the past about 15 years ago due to an inability to maintain weight and history of diarrhea.  Per patient results of that exam were normal.  Denies a history of hemorrhoids.  Notes that the blood in the toilet was significant this am, resembled first day of menses but occurred after BM and noted blood on toilet paper as well. Denies pain with BM or straining. Denies pain now.    She was scheduled for her colonoscopy in 2020 but was canceled due to pandemic.   Denies other digestive complaints today.   Denies a history of anemia   Denies a family history of Colon CA  ROS:  +blood in stool   See pertinent positives and negatives per HPI.  There are no problems to display for this patient.   Social History   Tobacco Use   Smoking status: Former    Packs/day: 2.50    Years: 22.00    Pack years: 55.00    Types: Cigarettes  Start date: 62    Quit date: 2005    Years since quitting: 17.5   Smokeless tobacco: Never  Substance Use Topics   Alcohol use: Yes    Alcohol/week: 6.0 standard drinks    Types: 6 Cans of beer per week   Current Outpatient Medications  Medication Instructions   albuterol (VENTOLIN HFA) 108 (90 Base) MCG/ACT inhaler INHALE 2 PUFFS INTO THE LUNGS EVERY 4-6 HOURS AS NEEDED   buPROPion (WELLBUTRIN SR) 100 MG 12 hr tablet TAKE 1 TABLET BY MOUTH EVERY MORNING   COVID-19 At Home Antigen Test (CARESTART COVID-19 HOME TEST) KIT Use as directed   FLUoxetine (PROZAC) 40 MG capsule TAKE 1 CAPSULE BY MOUTH EVERY MORNING   fluticasone-salmeterol (ADVAIR DISKUS) 250-50 MCG/ACT AEPB  No dose, route, or frequency recorded.   Fluticasone-Salmeterol (ADVAIR DISKUS) 250-50 MCG/DOSE AEPB 1 puff, Inhalation, 2 times daily, Pt needs appt for further refills.     No Known Allergies  Objective:   There were no vitals taken for this visit.  Patient is well-developed, well-nourished in no acute distress.  Resting comfortably at home.  Head is normocephalic, atraumatic.  No labored breathing.  Speech is clear and coherent with logical content.  Patient is alert and oriented at baseline.    Assessment and Plan:   Discussed with patient that this may be irritation from chronic diarrhea. If blood resolves today she may follow up with PCP on Tuesday.   If presence of blood persists or worsens and she notes blood in underwear when not having BM she will seek immediate medical attention as discussed.   Discussed managing diet today with bland starchy foods, may use imodium to decrease diarrhea episodes.  Push fluids and monitor hydration.   Encouraged scheduling colonoscopy, patient will call GI Tuesday if not seen prior.   I spent approximately 20 minutes on video call discussing management and coordinating care with the patient today.  Apolonio Schneiders, FNP 11/29/2020

## 2020-11-29 NOTE — Progress Notes (Signed)
Patient has already had video visit with group today- duplicate encounter

## 2020-11-30 ENCOUNTER — Encounter (HOSPITAL_COMMUNITY): Payer: Self-pay | Admitting: Physician Assistant

## 2020-11-30 ENCOUNTER — Ambulatory Visit (HOSPITAL_COMMUNITY)
Admission: EM | Admit: 2020-11-30 | Discharge: 2020-11-30 | Disposition: A | Discharge status: Home / Self Care | Payer: 59 | Attending: Physician Assistant | Admitting: Physician Assistant

## 2020-11-30 ENCOUNTER — Other Ambulatory Visit: Payer: Self-pay

## 2020-11-30 DIAGNOSIS — K625 Hemorrhage of anus and rectum: Secondary | ICD-10-CM | POA: Diagnosis not present

## 2020-11-30 LAB — CBC WITH DIFFERENTIAL/PLATELET
Abs Immature Granulocytes: 0.02 10*3/uL (ref 0.00–0.07)
Basophils Absolute: 0.1 10*3/uL (ref 0.0–0.1)
Basophils Relative: 1 %
Eosinophils Absolute: 0.5 10*3/uL (ref 0.0–0.5)
Eosinophils Relative: 8 %
HCT: 38.5 % (ref 36.0–46.0)
Hemoglobin: 12.9 g/dL (ref 12.0–15.0)
Immature Granulocytes: 0 %
Lymphocytes Relative: 32 %
Lymphs Abs: 2 10*3/uL (ref 0.7–4.0)
MCH: 31.2 pg (ref 26.0–34.0)
MCHC: 33.5 g/dL (ref 30.0–36.0)
MCV: 93.2 fL (ref 80.0–100.0)
Monocytes Absolute: 0.5 10*3/uL (ref 0.1–1.0)
Monocytes Relative: 9 %
Neutro Abs: 3.1 10*3/uL (ref 1.7–7.7)
Neutrophils Relative %: 50 %
Platelets: 455 10*3/uL — ABNORMAL HIGH (ref 150–400)
RBC: 4.13 MIL/uL (ref 3.87–5.11)
RDW: 13.2 % (ref 11.5–15.5)
WBC: 6.2 10*3/uL (ref 4.0–10.5)
nRBC: 0 % (ref 0.0–0.2)

## 2020-11-30 LAB — COMPREHENSIVE METABOLIC PANEL
ALT: 14 U/L (ref 0–44)
AST: 18 U/L (ref 15–41)
Albumin: 3.9 g/dL (ref 3.5–5.0)
Alkaline Phosphatase: 68 U/L (ref 38–126)
Anion gap: 12 (ref 5–15)
BUN: 15 mg/dL (ref 6–20)
CO2: 20 mmol/L — ABNORMAL LOW (ref 22–32)
Calcium: 9.1 mg/dL (ref 8.9–10.3)
Chloride: 107 mmol/L (ref 98–111)
Creatinine, Ser: 0.95 mg/dL (ref 0.44–1.00)
GFR, Estimated: >60 mL/min (ref >60)
Glucose, Bld: 85 mg/dL (ref 70–99)
Potassium: 4.2 mmol/L (ref 3.5–5.1)
Sodium: 139 mmol/L (ref 135–145)
Total Bilirubin: 0.7 mg/dL (ref 0.3–1.2)
Total Protein: 7.3 g/dL (ref 6.5–8.1)

## 2020-11-30 LAB — APTT: aPTT: 32 seconds (ref 24–36)

## 2020-11-30 LAB — PROTIME-INR
INR: 1.1 (ref 0.8–1.2)
Prothrombin Time: 14 seconds (ref 11.4–15.2)

## 2020-11-30 MED ORDER — HYDROCORTISONE ACETATE 25 MG RE SUPP
25.0000 mg | Freq: Two times a day (BID) | RECTAL | 0 refills | Status: AC
  Filled 2020-11-30: qty 12, 6d supply, fill #0

## 2020-11-30 NOTE — Discharge Instructions (Addendum)
Use suppository twice daily as needed to help with symptoms.  If you have any worsening symptoms including constant bleeding that is not just occurring with bowel movements you need to go to the emergency room.  Please go to the ER if you develop any severe abdominal pain, lightheadedness, chest pain, shortness of breath.  Follow-up with GI first thing Tuesday morning.  We will contact you if your lab work is abnormal.

## 2020-11-30 NOTE — ED Provider Notes (Signed)
[facility]    CSN: [id number] Arrival date & time: 11/30/20  1331      History   Chief Complaint Chief Complaint  Patient presents with  . Rectal Bleeding    HPI Kathy Ramsey is a 55 y.o. female.   Patient presents today with a 2 to 3-day history of bright red blood per rectum.  She participated in a video visit with her provider who thought symptoms were related to irritation from chronic diarrhea but given symptoms have persisted recommended evaluation.  Patient denies any changes to bowel habits including constipation.  She does have a history of chronic diarrhea and states current symptoms are similar to previous episodes of this condition.  Denies history of gastrointestinal disorder including hemorrhoids, Crohn's disease, ulcerative colitis.  She does not take any blood thinning medication and denies history of bleeding disorder.  She denies any significant bruising or blood in her urine.  She denies any associated abdominal pain, nausea, vomiting, lightheadedness, chest pain, heart racing.  She does have a GI specialist intends to follow-up with them 12/02/2020 for further evaluation.  Reports blood is only present following a bowel movement but is a significant amount to return the toilet water red; denies any bloody leakage into underwear.   History reviewed. No pertinent past medical history.  There are no problems to display for this patient.   History reviewed. No pertinent surgical history.  OB History   No obstetric history on file.      Home Medications    Prior to Admission medications   Medication Sig Start Date End Date Taking? Authorizing Provider  hydrocortisone (ANUSOL-HC) 25 MG suppository Place 1 suppository (25 mg total) rectally 2 (two) times daily. 11/30/20  Yes Shelia Magallon, Junie Panning K, PA-C  albuterol (VENTOLIN HFA) 108 (90 Base) MCG/ACT inhaler INHALE 2 PUFFS INTO THE LUNGS EVERY 4-6 HOURS AS NEEDED 09/29/20     buPROPion (WELLBUTRIN SR) 100 MG 12  hr tablet TAKE 1 TABLET BY MOUTH EVERY MORNING 04/30/20 04/30/21  Marda Stalker, PA-C  COVID-19 At Home Antigen Test [facility] COVID-19 HOME TEST) KIT Use as directed 11/17/20   Edmon Crape, RPH  FLUoxetine (PROZAC) 40 MG capsule TAKE 1 CAPSULE BY MOUTH EVERY MORNING 04/30/20 04/30/21  Marda Stalker, PA-C  fluticasone-salmeterol (ADVAIR DISKUS) 250-50 MCG/ACT AEPB     [provider]  Fluticasone-Salmeterol (ADVAIR DISKUS) 250-50 MCG/DOSE AEPB Inhale 1 puff into the lungs in the morning and at bedtime. Pt needs appt for further refills. 05/13/20   Spero Geralds, MD    Family History Family History  Problem Relation Age of Onset  . Asthma Mother   . COPD Mother   . Leukemia Mother   . Heart failure Mother   . CAD Father   . Heart failure Father   . Asthma Child     Social History Social History   Tobacco Use  . Smoking status: Former    Packs/day: 2.50    Years: 22.00    Pack years: 55.00    Types: Cigarettes    Start date: 71    Quit date: 2005    Years since quitting: 17.5  . Smokeless tobacco: Never  Substance Use Topics  . Alcohol use: Yes    Alcohol/week: 6.0 standard drinks    Types: 6 Cans of beer per week  . Drug use: Not Currently    Types: Marijuana     Allergies   Patient has no known allergies.   Review of  Systems Review of Systems  Constitutional:  Negative for activity change, appetite change, fatigue and fever.  Respiratory:  Negative for cough and shortness of breath.   Cardiovascular:  Negative for chest pain.  Gastrointestinal:  Positive for blood in stool and diarrhea (chronic). Negative for abdominal pain, nausea and vomiting.  Musculoskeletal:  Negative for arthralgias and myalgias.  Neurological:  Negative for dizziness, light-headedness and headaches.    Physical Exam Triage Vital Signs ED Triage Vitals  Enc Vitals Group     BP 11/30/20 1435 117/78     Pulse Rate 11/30/20 1435 68     Resp 11/30/20 1435 18      Temp 11/30/20 1435 98.7 F (37.1 C)     Temp src --      SpO2 11/30/20 1435 100 %     Weight --      Height --      Head Circumference --      Peak Flow --      Pain Score 11/30/20 1432 0     Pain Loc --      Pain Edu? --      Excl. in [place]? --    No data found.  Updated Vital Signs BP 117/78   Pulse 68   Temp 98.7 F (37.1 C)   Resp 18   LMP 11/28/2020 (Exact Date)   SpO2 100%   Visual Acuity Right Eye Distance:   Left Eye Distance:   Bilateral Distance:    Right Eye Near:   Left Eye Near:    Bilateral Near:     Physical Exam Vitals reviewed. Exam conducted with a chaperone present.  Constitutional:      General: She is awake. She is not in acute distress.    Appearance: Normal appearance. She is normal weight. She is not ill-appearing.     Comments: Very pleasant female appears stated age in no acute distress  HENT:     Head: Normocephalic and atraumatic.  Cardiovascular:     Rate and Rhythm: Normal rate and regular rhythm.     Heart sounds: Normal heart sounds, S1 normal and S2 normal. No murmur heard. Pulmonary:     Effort: Pulmonary effort is normal.     Breath sounds: Normal breath sounds. No wheezing, rhonchi or rales.     Comments: Clear to auscultation bilaterally Abdominal:     General: Bowel sounds are normal.     Palpations: Abdomen is soft.     Tenderness: There is no abdominal tenderness. There is no right CVA tenderness, left CVA tenderness, guarding or rebound.     Comments: Benign abdominal exam  Genitourinary:    Rectum: Internal hemorrhoid present. No tenderness, anal fissure or external hemorrhoid. Normal anal tone.     Comments: Lovena Le, CMA present as chaperone during exam. Psychiatric:        Behavior: Behavior is cooperative.     UC Treatments / Results  Labs (all labs ordered are listed, but only abnormal results are displayed) Labs Reviewed  CBC WITH DIFFERENTIAL/PLATELET  COMPREHENSIVE METABOLIC PANEL  PROTIME-INR  APTT     EKG   Radiology No results found.  Procedures Procedures (including critical care time)  Medications Ordered in UC Medications - No data to display  Initial Impression / Assessment and Plan / UC Course  I have reviewed the triage vital signs and the nursing notes.  Pertinent labs & imaging results that were available during my care of the patient were reviewed by me  and considered in my medical decision making (see chart for details).      Internal hemorrhoids were noted on exam but unsure that this is causing quantity of blood patient is noticing.  She is hemodynamically stable and there is no indication based on her exam today for emergent evaluation or imaging.  She was prescribed hydrocortisone suppositories to help manage symptoms.  CBC, CMP, bleeding studies obtained today-results pending.  Discussed the importance of following up with GI specialist she will likely need colonoscopy and further testing to evaluate symptoms.  Discussed alarm symptoms including blood in underwear, chest pain, shortness of breath, heart racing, nausea, vomiting, severe fatigue that warrant emergent evaluation.  Strict return precautions given to which patient expressed understanding.  Final Clinical Impressions(s) / UC Diagnoses   Final diagnoses:  BRBPR (bright red blood per rectum)     Discharge Instructions      Use suppository twice daily as needed to help with symptoms.  If you have any worsening symptoms including constant bleeding that is not just occurring with bowel movements you need to go to the emergency room.  Please go to the ER if you develop any severe abdominal pain, lightheadedness, chest pain, shortness of breath.  Follow-up with GI first thing Tuesday morning.  We will contact you if your lab work is abnormal.     ED Prescriptions     Medication Sig Dispense Auth. Provider   hydrocortisone (ANUSOL-HC) 25 MG suppository Place 1 suppository (25 mg total) rectally 2  (two) times daily. 12 suppository Markela Wee, Derry Skill, PA-C      PDMP not reviewed this encounter.   Terrilee Croak, PA-C 11/30/20 1547

## 2020-11-30 NOTE — ED Triage Notes (Signed)
Pt reports blood in stool with yesterday with clots the patient had a virtual  visit  with [facility].

## 2020-12-01 ENCOUNTER — Telehealth: Payer: Self-pay | Admitting: Nurse Practitioner

## 2020-12-01 NOTE — Telephone Encounter (Signed)
No answer

## 2020-12-02 ENCOUNTER — Encounter: Payer: Self-pay | Admitting: Nurse Practitioner

## 2020-12-02 ENCOUNTER — Other Ambulatory Visit (HOSPITAL_COMMUNITY): Payer: Self-pay

## 2020-12-03 ENCOUNTER — Other Ambulatory Visit: Payer: Self-pay | Admitting: Nurse Practitioner

## 2020-12-03 ENCOUNTER — Telehealth: Payer: Self-pay | Admitting: Nurse Practitioner

## 2020-12-03 ENCOUNTER — Encounter: Payer: Self-pay | Admitting: Nurse Practitioner

## 2020-12-03 DIAGNOSIS — K625 Hemorrhage of anus and rectum: Secondary | ICD-10-CM

## 2020-12-03 NOTE — Progress Notes (Signed)
Referral placed to GI Urgent due to ongoing symptoms- see previous UC and Video visit notes.

## 2020-12-03 NOTE — Telephone Encounter (Signed)
Left message

## 2020-12-10 ENCOUNTER — Other Ambulatory Visit (HOSPITAL_COMMUNITY): Payer: Self-pay

## 2020-12-11 ENCOUNTER — Encounter: Payer: Self-pay | Admitting: Physician Assistant

## 2020-12-11 ENCOUNTER — Ambulatory Visit (INDEPENDENT_AMBULATORY_CARE_PROVIDER_SITE_OTHER): Payer: 59 | Admitting: Physician Assistant

## 2020-12-11 VITALS — BP 102/60 | HR 77 | Ht 68.0 in | Wt 148.0 lb

## 2020-12-11 DIAGNOSIS — K625 Hemorrhage of anus and rectum: Secondary | ICD-10-CM | POA: Diagnosis not present

## 2020-12-11 DIAGNOSIS — K529 Noninfective gastroenteritis and colitis, unspecified: Secondary | ICD-10-CM | POA: Diagnosis not present

## 2020-12-11 NOTE — Progress Notes (Signed)
Chief Complaint: Rectal bleeding  HPI:    Mrs.Anstead is a 55 year old female with a past medical history as listed below, who was referred to me by Marda Stalker, PA-C for a complaint of rectal bleeding.     04/08/2001 Dr. Henrene Pastor colonoscopy normal.    Looks like patient was last seen by Dr. Therisa Doyne 01/22/2019 for a colonoscopy.  We do not have results in the computer.  Patient tells me she never ended up having this but we are searching for records.    11/30/2020 patient seen in the urgent care for 2 to 3 days of bright red blood per rectum.  Apparently had a video visit with her PCP who thought this is related to irritation from chronic diarrhea but symptoms persisted so she went to the urgent care.  At that time described issues with chronic diarrhea.  At that time blood was only present follow a bowel movement there was a significant amount enough to turn the toilet water red.  At that time found to have an internal hemorrhoid.  CBC CMP and PT/INR were normal.  She was given hydrocortisone suppositories to help manage symptoms.    Today, the patient presents to clinic and tells me that she chronically has diarrhea off and on after eating certain foods which is very urgent.  Apparently it was suggested she had IBS-D 8 years ago, but this has been unchanged and it was not an issue when she started with bright red blood from her rectum on 11/29/2020.  Tells me that morning she sat down and had a little bit of stool and saw a clot of blood and a gush of bright red blood which filled the toilet and was quite a lot in amount.  This continued for 8 to 9 days, though was about 1 to 2 tablespoons instead of the gush initially, and now over this past weekend she has not seen any more, this will be the third day.  Tells me this blood was only ever with a bowel movement but it was a couple times a day.    Social history Works for [employer].    Denies fever, chills, weight loss, abdominal or rectal pain.  Past  Medical History:  Diagnosis Date   Asthma    adult onset    Past Surgical History:  Procedure Laterality Date   COLONOSCOPY     ESOPHAGOGASTRODUODENOSCOPY     MOUTH SURGERY     wisdom teeth    Current Outpatient Medications  Medication Sig Dispense Refill   albuterol (VENTOLIN HFA) 108 (90 Base) MCG/ACT inhaler INHALE 2 PUFFS INTO THE LUNGS EVERY 4-6 HOURS AS NEEDED 18 g 1   buPROPion (WELLBUTRIN SR) 100 MG 12 hr tablet TAKE 1 TABLET BY MOUTH EVERY MORNING 90 tablet 1   COVID-19 At Home Antigen Test ([facility] COVID-19 HOME TEST) KIT Use as directed 4 each 0   FLUoxetine (PROZAC) 40 MG capsule TAKE 1 CAPSULE BY MOUTH EVERY MORNING 90 capsule 1   Fluticasone-Salmeterol (ADVAIR DISKUS) 250-50 MCG/DOSE AEPB Inhale 1 puff into the lungs in the morning and at bedtime. Pt needs appt for further refills. 60 each 0   fluticasone-salmeterol (ADVAIR) 250-50 MCG/ACT AEPB      hydrocortisone (ANUSOL-HC) 25 MG suppository Place 1 suppository (25 mg total) rectally 2 (two) times daily. (Patient not taking: Reported on 12/11/2020) 12 suppository 0   No current facility-administered medications for this visit.    Allergies as of 12/11/2020   (No Known  Allergies)    Family History  Problem Relation Age of Onset   Asthma Mother    COPD Mother    Leukemia Mother    Heart failure Mother    Stomach cancer Mother        duodenal   CAD Father    Heart failure Father    Asthma Child     Social History   Socioeconomic History   Marital status: Married    Spouse name: Not on file   Number of children: Not on file   Years of education: Not on file   Highest education level: Not on file  Occupational History   Not on file  Tobacco Use   Smoking status: Former    Packs/day: 2.50    Years: 22.00    Pack years: 55.00    Types: Cigarettes    Start date: 40    Quit date: 2005    Years since quitting: 17.5   Smokeless tobacco: Never  Substance and Sexual Activity   Alcohol use: Yes     Alcohol/week: 6.0 standard drinks    Types: 6 Cans of beer per week   Drug use: Not Currently    Types: Marijuana   Sexual activity: Not on file  Other Topics Concern   Not on file  Social History Narrative   Not on file   Social Determinants of Health   Financial Resource Strain: Not on file  Food Insecurity: Not on file  Transportation Needs: Not on file  Physical Activity: Not on file  Stress: Not on file  Social Connections: Not on file  Intimate Partner Violence: Not on file    Review of Systems:    Constitutional: No weight loss, fever or chills Skin: No rash  Cardiovascular: No chest pain Respiratory: No SOB  Gastrointestinal: See HPI and otherwise negative Genitourinary: No dysuria Neurological: No headache, dizziness or syncope Musculoskeletal: No new muscle or joint pain Hematologic: No bruising Psychiatric: No history of depression or anxiety   Physical Exam:  Vital signs: BP 102/60   Pulse 77   Ht _0  (1.727 m)   Wt 148 lb (67.1 kg)   LMP 11/28/2020 (Exact Date)   BMI 22.50 kg/m    Constitutional:   Pleasant Caucasian female appears to be in NAD, Well developed, Well nourished, alert and cooperative Head:  Normocephalic and atraumatic. Eyes:   PEERL, EOMI. No icterus. Conjunctiva pink. Ears:  Normal auditory acuity. Neck:  Supple Throat: Oral cavity and pharynx without inflammation, swelling or lesion.  Respiratory: Respirations even and unlabored. Lungs clear to auscultation bilaterally.   No wheezes, crackles, or rhonchi.  Cardiovascular: Normal S1, S2. No MRG. Regular rate and rhythm. No peripheral edema, cyanosis or pallor.  Gastrointestinal:  Soft, nondistended, nontender. No rebound or guarding. Normal bowel sounds. No appreciable masses or hepatomegaly. Rectal:  Deferred (colonoscopy scheduled tomorrow) Msk:  Symmetrical without gross deformities. Without edema, no deformity or joint abnormality.  Neurologic:  Alert and  oriented x4;   grossly normal neurologically.  Skin:   Dry and intact without significant lesions or rashes. Psychiatric: Demonstrates good judgement and reason without abnormal affect or behaviors.  RELEVANT LABS AND IMAGING: CBC    Component Value Date/Time   WBC 6.2 11/30/2020 1534   RBC 4.13 11/30/2020 1534   HGB 12.9 11/30/2020 1534   HCT 38.5 11/30/2020 1534   PLT 455 (H) 11/30/2020 1534   MCV 93.2 11/30/2020 1534   MCH 31.2 11/30/2020 1534   MCHC  33.5 11/30/2020 1534   RDW 13.2 11/30/2020 1534   LYMPHSABS 2.0 11/30/2020 1534   MONOABS 0.5 11/30/2020 1534   EOSABS 0.5 11/30/2020 1534   BASOSABS 0.1 11/30/2020 1534    CMP     Component Value Date/Time   NA 139 11/30/2020 1534   K 4.2 11/30/2020 1534   CL 107 11/30/2020 1534   CO2 20 (L) 11/30/2020 1534   GLUCOSE 85 11/30/2020 1534   BUN 15 11/30/2020 1534   CREATININE 0.95 11/30/2020 1534   CALCIUM 9.1 11/30/2020 1534   PROT 7.3 11/30/2020 1534   ALBUMIN 3.9 11/30/2020 1534   AST 18 11/30/2020 1534   ALT 14 11/30/2020 1534   ALKPHOS 68 11/30/2020 1534   BILITOT 0.7 11/30/2020 1534   GFRNONAA >60 11/30/2020 1534    Assessment: 1.  Bright red blood per rectum: Reports last colonoscopy in 2002 with Dr. Henrene Pastor, was supposed to have one in 2020 but never followed through due to COVID, started with rectal bleeding 10 days ago, none in the past 3 days, only with a bowel movement, urgent care saw "internal hemorrhoids"; most likely hemorrhoids versus other 2.  Chronic diarrhea: Off-and-on, patient tells me she has been diagnosed with IBS-D in the past, unchanged recently  Plan: 1.  Patient was eager to have her colonoscopy.  This was scheduled Dr. Rush Landmark tomorrow as he had availability.  Did provide the patient with a detailed list of risks of the procedure and she agrees to proceed.  Patient will continue to follow with Dr. Rush Landmark as her primary GI physician from this time forward. 2.  Patient to follow in clinic per  recommendations from Dr. Rush Landmark after time of procedure.  Ellouise Newer, PA-C [facility] 12/11/2020, 9:34 AM  Cc: Marda Stalker, PA-C

## 2020-12-11 NOTE — Progress Notes (Signed)
Attending Physician's Attestation   I have reviewed the chart.   I agree with the Advanced Practitioner's note, impression, and recommendations with any updates as below. Diagnostic/Screening Colonoscopy is reasonable next step.  Plan for biopsies as well in setting of IBS-D symptoms for years as well to rule out microscopic colitis.   Justice Britain, MD [facility] Advanced Endoscopy Office # [phone]

## 2020-12-11 NOTE — Patient Instructions (Signed)
You have been scheduled for a colonoscopy. Please follow written instructions given to you at your visit today.  Please pick up your prep supplies at the pharmacy within the next 1-3 days. If you use inhalers (even only as needed), please bring them with you on the day of your procedure.  If you are age 53 or older, your body mass index should be between 23-30. Your Body mass index is 22.5 kg/m. If this is out of the aforementioned range listed, please consider follow up with your Primary Care Provider.  If you are age 27 or younger, your body mass index should be between 19-25. Your Body mass index is 22.5 kg/m. If this is out of the aformentioned range listed, please consider follow up with your Primary Care Provider.   __________________________________________________________  The [facility] providers would like to encourage you to use [facility] to communicate with providers for non-urgent requests or questions.  Due to long hold times on the telephone, sending your provider a message by [facility] may be a faster and more efficient way to get a response.  Please allow 48 business hours for a response.  Please remember that this is for non-urgent requests.

## 2020-12-12 ENCOUNTER — Other Ambulatory Visit: Payer: Self-pay

## 2020-12-12 ENCOUNTER — Ambulatory Visit (AMBULATORY_SURGERY_CENTER): Payer: 59 | Admitting: Gastroenterology

## 2020-12-12 ENCOUNTER — Encounter: Payer: Self-pay | Admitting: Gastroenterology

## 2020-12-12 VITALS — BP 108/57 | HR 66 | Temp 98.9°F | Resp 17 | Ht 68.0 in | Wt 148.0 lb

## 2020-12-12 DIAGNOSIS — K635 Polyp of colon: Secondary | ICD-10-CM

## 2020-12-12 DIAGNOSIS — K625 Hemorrhage of anus and rectum: Secondary | ICD-10-CM | POA: Diagnosis not present

## 2020-12-12 DIAGNOSIS — D123 Benign neoplasm of transverse colon: Secondary | ICD-10-CM

## 2020-12-12 DIAGNOSIS — D120 Benign neoplasm of cecum: Secondary | ICD-10-CM

## 2020-12-12 DIAGNOSIS — K529 Noninfective gastroenteritis and colitis, unspecified: Secondary | ICD-10-CM

## 2020-12-12 DIAGNOSIS — R197 Diarrhea, unspecified: Secondary | ICD-10-CM | POA: Diagnosis not present

## 2020-12-12 MED ORDER — SODIUM CHLORIDE 0.9 % IV SOLN: 500.0000 mL | Freq: Once | INTRAVENOUS | Status: DC

## 2020-12-12 NOTE — Op Note (Addendum)
[place] Patient Name: Kathy Ramsey Procedure Date: 12/12/2020 3:04 PM MRN: [id number] Endoscopist: Justice Britain , MD Age: 55 Referring MD:  Date of Birth: 20-Feb-1966 Gender: Female Account #: [id number] Procedure:                Colonoscopy Indications:              Screening for colorectal malignant neoplasm,                            Incidental - Diarrhea (reported chronic IBS-D),                            Incidental - Hematochezia Medicines:                Monitored Anesthesia Care Procedure:                Pre-Anesthesia Assessment:                           - Prior to the procedure, a History and Physical                            was performed, and patient medications and                            allergies were reviewed. The patient's tolerance of                            previous anesthesia was also reviewed. The risks                            and benefits of the procedure and the sedation                            options and risks were discussed with the patient.                            All questions were answered, and informed consent                            was obtained. Prior Anticoagulants: The patient has                            taken no previous anticoagulant or antiplatelet                            agents. ASA Grade Assessment: II - A patient with                            mild systemic disease. After reviewing the risks                            and benefits, the patient was deemed in  satisfactory condition to undergo the procedure.                           After obtaining informed consent, the colonoscope                            was passed under direct vision. Throughout the                            procedure, the patient's blood pressure, pulse, and                            oxygen saturations were monitored continuously. The                            Olympus PCF-H190DL (#[id number])  Colonoscope was                            introduced through the anus and advanced to the 5                            cm into the ileum. The colonoscopy was performed                            without difficulty. The patient tolerated the                            procedure. The quality of the bowel preparation was                            good. The terminal ileum, ileocecal valve,                            appendiceal orifice, and rectum were photographed. Scope In: 3:27:49 PM Scope Out: 3:45:13 PM Scope Withdrawal Time: 0 hours 12 minutes 57 seconds  Total Procedure Duration: 0 hours 17 minutes 24 seconds  Findings:                 The digital rectal exam findings include small                            external skin tags and hemorrhoids. Pertinent                            negatives include no palpable rectal lesions.                           The terminal ileum and ileocecal valve appeared                            normal.                           Two sessile polyps were found in the transverse  colon and cecum. The polyps were 2 to 6 mm in size.                            These polyps were removed with a cold snare.                            Resection and retrieval were complete.                           Normal mucosa was found in the entire colon                            otherwise. Biopsies for histology were taken with a                            cold forceps from the entire colon for evaluation                            of microscopic/collagenous colitis.                           Non-bleeding non-thrombosed external and internal                            hemorrhoids were found during retroflexion, during                            perianal exam and during digital exam. The                            hemorrhoids were Grade II (internal hemorrhoids                            that prolapse but reduce spontaneously). Complications:             No immediate complications. Estimated Blood Loss:     Estimated blood loss was minimal. Impression:               - Hemorrhoids found on digital rectal exam.                           - The examined portion of the ileum was normal.                           - Two 2 to 6 mm polyps in the transverse colon and                            in the cecum, removed with a cold snare. Resected                            and retrieved.                           - Normal mucosa in the entire examined colon  otherwise. Biopsied.                           - Non-bleeding non-thrombosed external and internal                            hemorrhoids. Recommendation:           - The patient will be observed post-procedure,                            until all discharge criteria are met.                           - Discharge patient to home.                           - Patient has a contact number available for                            emergencies. The signs and symptoms of potential                            delayed complications were discussed with the                            patient. Return to normal activities tomorrow.                            Written discharge instructions were provided to the                            patient.                           - High fiber diet.                           - Fiber bulking with Metamucil or Benefiber 1-2                            times daily if diarrhea seems to be occuring.                           - Continue present medications.                           - May use Anusol suppositories nightly x 1-week and                            then every other night until Rx is complete - if                            issues of bleeding recur. If this becomes a more  chronic issue then discussion with my colleagues to                            consider hemorrhoidal banding protocol can be had                             vs need for Colorectal surgery evaluation.                           - Await pathology results.                           - Repeat colonoscopy in 10/04/08 years for                            surveillance based on pathology results and                            adenomatous tissue.                           - The findings and recommendations were discussed                            with the patient.                           - The findings and recommendations were discussed                            with the patient's family. Justice Britain, MD 12/12/2020 3:52:49 PM

## 2020-12-12 NOTE — Patient Instructions (Signed)
Impression/Recommendations:  Polyp, high fiber diet,  and hemorrhoid handouts given to patient.  Fiber bulking with Metamucil or Benfiber 1-2 times daily if diarrhea seems to be occurring.  Continue present medications. May use Anusol suppositories nightly for 1 week, then every other night until Rx is completed, if issues of bleeding recur.  Await pathology results.  Repeat colonoscopy for surveillance.  Date ti be determined after pathology results reviewed.  YOU HAD AN ENDOSCOPIC PROCEDURE TODAY AT [facility]:   Refer to the procedure report that was given to you for any specific questions about what was found during the examination.  If the procedure report does not answer your questions, please call your gastroenterologist to clarify.  If you requested that your care partner not be given the details of your procedure findings, then the procedure report has been included in a sealed envelope for you to review at your convenience later.  YOU SHOULD EXPECT: Some feelings of bloating in the abdomen. Passage of more gas than usual.  Walking can help get rid of the air that was put into your GI tract during the procedure and reduce the bloating. If you had a lower endoscopy (such as a colonoscopy or flexible sigmoidoscopy) you may notice spotting of blood in your stool or on the toilet paper. If you underwent a bowel prep for your procedure, you may not have a normal bowel movement for a few days.  Please Note:  You might notice some irritation and congestion in your nose or some drainage.  This is from the oxygen used during your procedure.  There is no need for concern and it should clear up in a day or so.  SYMPTOMS TO REPORT IMMEDIATELY:  Following lower endoscopy (colonoscopy or flexible sigmoidoscopy):  Excessive amounts of blood in the stool  Significant tenderness or worsening of abdominal pains  Swelling of the abdomen that is new, acute  Fever of 100F or  higher For urgent or emergent issues, a gastroenterologist can be reached at any hour by calling [phone]. Do not use MyChart messaging for urgent concerns.    DIET:  We do recommend a small meal at first, but then you may proceed to your regular diet.  Drink plenty of fluids but you should avoid alcoholic beverages for 24 hours.  ACTIVITY:  You should plan to take it easy for the rest of today and you should NOT DRIVE or use heavy machinery until tomorrow (because of the sedation medicines used during the test).    FOLLOW UP: Our staff will call the number listed on your records 48-72 hours following your procedure to check on you and address any questions or concerns that you may have regarding the information given to you following your procedure. If we do not reach you, we will leave a message.  We will attempt to reach you two times.  During this call, we will ask if you have developed any symptoms of COVID 19. If you develop any symptoms (ie: fever, flu-like symptoms, shortness of breath, cough etc.) before then, please call [phone].  If you test positive for Covid 19 in the 2 weeks post procedure, please call and report this information to [place].    If any biopsies were taken you will be contacted by phone or by letter within the next 1-3 weeks.  Please call us at [phone] if you have not heard about the biopsies in 3 weeks.    SIGNATURES/CONFIDENTIALITY: You and/or your care  partner have signed paperwork which will be entered into your electronic medical record.  These signatures attest to the fact that that the information above on your After Visit Summary has been reviewed and is understood.  Full responsibility of the confidentiality of this discharge information lies with you and/or your care-partner.  

## 2020-12-12 NOTE — Progress Notes (Signed)
A/ox3, pleased with MAC, report to RN 

## 2020-12-12 NOTE — Progress Notes (Signed)
[facility]- [facility] Cell phone off per pt

## 2020-12-16 ENCOUNTER — Telehealth: Payer: Self-pay | Admitting: *Deleted

## 2020-12-16 ENCOUNTER — Telehealth: Payer: Self-pay

## 2020-12-16 NOTE — Telephone Encounter (Signed)
Left message on follow up call. 

## 2020-12-16 NOTE — Telephone Encounter (Signed)
Message left

## 2020-12-18 ENCOUNTER — Encounter: Payer: Self-pay | Admitting: Gastroenterology

## 2021-01-05 ENCOUNTER — Other Ambulatory Visit (HOSPITAL_COMMUNITY): Payer: Self-pay

## 2021-01-05 DIAGNOSIS — J4541 Moderate persistent asthma with (acute) exacerbation: Secondary | ICD-10-CM | POA: Diagnosis not present

## 2021-01-05 DIAGNOSIS — Z0000 Encounter for general adult medical examination without abnormal findings: Secondary | ICD-10-CM | POA: Diagnosis not present

## 2021-01-05 DIAGNOSIS — F329 Major depressive disorder, single episode, unspecified: Secondary | ICD-10-CM | POA: Diagnosis not present

## 2021-01-05 DIAGNOSIS — Z23 Encounter for immunization: Secondary | ICD-10-CM | POA: Diagnosis not present

## 2021-01-05 DIAGNOSIS — Z13220 Encounter for screening for lipoid disorders: Secondary | ICD-10-CM | POA: Diagnosis not present

## 2021-01-05 MED ORDER — FLUOXETINE HCL 40 MG PO CAPS
ORAL_CAPSULE | ORAL | 3 refills | Status: DC
  Filled 2021-01-05 – 2021-04-28 (×3): qty 90, 90d supply, fill #0
  Filled 2021-10-25: qty 90, 90d supply, fill #1
  Filled 2021-12-02: qty 90, 90d supply, fill #2

## 2021-01-05 MED ORDER — BUPROPION HCL ER (SR) 100 MG PO TB12
ORAL_TABLET | ORAL | 3 refills | Status: DC
  Filled 2021-01-05 – 2021-04-28 (×2): qty 90, 90d supply, fill #0
  Filled 2021-10-25: qty 90, 90d supply, fill #1
  Filled 2021-12-02: qty 90, 90d supply, fill #2

## 2021-01-05 MED ORDER — FLUTICASONE-SALMETEROL 250-50 MCG/ACT IN AEPB
INHALATION_SPRAY | RESPIRATORY_TRACT | 2 refills | Status: DC
  Filled 2021-01-05: qty 180, 90d supply, fill #0
  Filled 2021-04-02: qty 180, 90d supply, fill #1

## 2021-01-05 MED ORDER — MONTELUKAST SODIUM 10 MG PO TABS
ORAL_TABLET | ORAL | 3 refills | Status: DC
  Filled 2021-01-05: qty 90, 90d supply, fill #0

## 2021-01-05 MED ORDER — ALBUTEROL SULFATE HFA 108 (90 BASE) MCG/ACT IN AERS
INHALATION_SPRAY | RESPIRATORY_TRACT | 2 refills | Status: DC
  Filled 2021-01-05: qty 18, 30d supply, fill #0
  Filled 2021-04-07 – 2021-04-28 (×2): qty 18, 30d supply, fill #1
  Filled 2021-10-25: qty 18, 30d supply, fill #2

## 2021-04-02 ENCOUNTER — Other Ambulatory Visit (HOSPITAL_COMMUNITY): Payer: Self-pay

## 2021-04-07 ENCOUNTER — Other Ambulatory Visit (HOSPITAL_COMMUNITY): Payer: Self-pay

## 2021-04-08 ENCOUNTER — Other Ambulatory Visit (HOSPITAL_COMMUNITY): Payer: Self-pay

## 2021-04-10 ENCOUNTER — Ambulatory Visit: Payer: 59

## 2021-04-15 ENCOUNTER — Other Ambulatory Visit (HOSPITAL_COMMUNITY): Payer: Self-pay

## 2021-04-20 ENCOUNTER — Other Ambulatory Visit (HOSPITAL_COMMUNITY): Payer: Self-pay

## 2021-04-28 ENCOUNTER — Other Ambulatory Visit (HOSPITAL_COMMUNITY): Payer: Self-pay

## 2021-05-26 DIAGNOSIS — H524 Presbyopia: Secondary | ICD-10-CM | POA: Diagnosis not present

## 2021-10-27 ENCOUNTER — Other Ambulatory Visit (HOSPITAL_COMMUNITY): Payer: Self-pay

## 2021-10-29 ENCOUNTER — Other Ambulatory Visit (HOSPITAL_COMMUNITY): Payer: Self-pay

## 2021-10-29 ENCOUNTER — Encounter: Payer: Self-pay | Admitting: Internal Medicine

## 2021-10-29 ENCOUNTER — Ambulatory Visit (INDEPENDENT_AMBULATORY_CARE_PROVIDER_SITE_OTHER): Payer: 59 | Admitting: Internal Medicine

## 2021-10-29 VITALS — BP 120/76 | HR 74 | Temp 98.0°F | Ht 68.0 in | Wt 156.0 lb

## 2021-10-29 DIAGNOSIS — J449 Chronic obstructive pulmonary disease, unspecified: Secondary | ICD-10-CM | POA: Diagnosis not present

## 2021-10-29 DIAGNOSIS — J301 Allergic rhinitis due to pollen: Secondary | ICD-10-CM

## 2021-10-29 DIAGNOSIS — J4541 Moderate persistent asthma with (acute) exacerbation: Secondary | ICD-10-CM

## 2021-10-29 MED ORDER — ALBUTEROL SULFATE (2.5 MG/3ML) 0.083% IN NEBU
2.5000 mg | INHALATION_SOLUTION | Freq: Four times a day (QID) | RESPIRATORY_TRACT | 5 refills | Status: DC | PRN
  Filled 2021-10-29: qty 75, 7d supply, fill #0
  Filled 2022-07-14: qty 90, 8d supply, fill #1

## 2021-10-29 MED ORDER — FLUTICASONE-SALMETEROL 250-50 MCG/ACT IN AEPB
1.0000 | INHALATION_SPRAY | Freq: Two times a day (BID) | RESPIRATORY_TRACT | 5 refills | Status: DC
  Filled 2021-10-29: qty 60, 30d supply, fill #0
  Filled 2021-12-02: qty 60, 30d supply, fill #1
  Filled 2022-04-18: qty 60, 30d supply, fill #2
  Filled 2022-09-27 (×2): qty 60, 30d supply, fill #3

## 2021-10-29 MED ORDER — PREDNISONE 10 MG PO TABS
ORAL_TABLET | ORAL | 0 refills | Status: AC
  Filled 2021-10-29: qty 20, 8d supply, fill #0

## 2021-10-29 MED ORDER — ALBUTEROL SULFATE HFA 108 (90 BASE) MCG/ACT IN AERS
INHALATION_SPRAY | RESPIRATORY_TRACT | 2 refills | Status: DC
  Filled 2021-10-29: qty 18, fill #0
  Filled 2021-12-02: qty 18, 30d supply, fill #1
  Filled 2021-12-02: qty 18, 30d supply, fill #0
  Filled 2022-01-11: qty 6.7, 17d supply, fill #1
  Filled 2022-02-13: qty 20.1, 25d supply, fill #2
  Filled 2022-04-18: qty 6.7, 30d supply, fill #3
  Filled 2022-05-20 – 2022-07-14 (×2): qty 18, fill #4

## 2021-10-29 MED ORDER — ALBUTEROL SULFATE (2.5 MG/3ML) 0.083% IN NEBU
2.5000 mg | INHALATION_SOLUTION | Freq: Once | RESPIRATORY_TRACT | Status: AC
  Administered 2021-10-29: 2.5 mg via RESPIRATORY_TRACT

## 2021-10-29 MED ORDER — MONTELUKAST SODIUM 10 MG PO TABS
10.0000 mg | ORAL_TABLET | Freq: Every day | ORAL | 5 refills | Status: DC
  Filled 2021-10-29: qty 30, 30d supply, fill #0
  Filled 2021-12-02: qty 30, 30d supply, fill #2
  Filled 2021-12-02: qty 30, 30d supply, fill #1
  Filled 2022-01-11: qty 30, 30d supply, fill #2
  Filled 2022-02-13: qty 30, 30d supply, fill #3
  Filled 2022-04-18: qty 30, 30d supply, fill #4
  Filled 2022-05-20: qty 30, 30d supply, fill #5

## 2021-10-29 MED ORDER — ALBUTEROL SULFATE HFA 108 (90 BASE) MCG/ACT IN AERS
INHALATION_SPRAY | RESPIRATORY_TRACT | 5 refills | Status: DC
  Filled 2021-10-29: qty 18, 16d supply, fill #0
  Filled 2022-05-20 (×3): qty 6.7, 17d supply, fill #0
  Filled 2022-07-14 – 2022-09-27 (×3): qty 6.7, 17d supply, fill #1

## 2021-10-29 NOTE — Patient Instructions (Addendum)
Please schedule follow up scheduled with myself in 1 months.  If my schedule is not open yet, we will contact you with a reminder closer to that time. Please call [phone] if you haven't heard from [place] a month before.   Before your next visit I would like you to have:  Full set of PFTs - appt with me afterwards.   Start prednisone taper as prescribed. Take albuterol nebulizer or inhaler treatments every 4-6 hours for the next couple of days until you start feeling better.   I will prescribe a nebulizer machine for your home. You can pick up the treatments at the pharmacy.   Start taking singulair 1 pill once a day. You can take this with claritin. This will help allergies.

## 2021-10-29 NOTE — Progress Notes (Signed)
Kathy Ramsey    [id number]    09-11-1965  Primary Care Physician:Wharton, Myrtha Mantis Date of Appointment: 10/29/2021 Established Patient Visit  Chief complaint:   Chief Complaint  Patient presents with   Follow-up    She has some dry cough mostly and some times productive. She has been worse in the last month.      HPI: Kathy Ramsey is a 56 y.o. woman with moderate persistent asthma.   Interval Updates: Last seen by me almost 3 years ago. Started on advair. Never got PFTs.   Has been taking advair intermittently over the last couple of years. Has been back on because   Symptoms worse at night, better during the day.   Associated with coughing, wheezing, chest tightness, crackling in her chest.  She does use albuterol which helps the nocturnal symptoms with coughing. And does seem to help.    Also having voice hoarseness, worsening shortness of breath in the last few weeks. Advair doesn't seem to be helping as much as in the past.  Her mother died from COPD and she has some apprehension taking prednisone due to side effects from longterm use.   Current Regimen: advair 25 1 puff bid, albuterol Asthma Triggers: weather changes,  Exacerbations in the last year: has been prescribed prednisone, never took it.  History of hospitalization or intubation: never Allergy Testing: never GERD: denies Allergic Rhinitis: yes. Has been taking claritin daily for the last few days.   ACT:  Asthma Control Test ACT Total Score  10/29/2021  3:37 PM 13  04/20/2019 11:57 AM 8   FeNO:  I have reviewed the patient's family social and past medical history and updated as appropriate.   Past Medical History:  Diagnosis Date   Asthma    adult onset    Past Surgical History:  Procedure Laterality Date   CESAREAN SECTION     COLONOSCOPY     ESOPHAGOGASTRODUODENOSCOPY     MOUTH SURGERY     wisdom teeth    Family History  Problem Relation Age of Onset   Asthma  Mother    COPD Mother    Leukemia Mother    Heart failure Mother    Stomach cancer Mother        duodenal   CAD Father    Heart failure Father    Asthma Child    Colon cancer Neg Hx    Diabetes Neg Hx    Rectal cancer Neg Hx     Social History   Occupational History   Not on file  Tobacco Use   Smoking status: Former    Packs/day: 2.50    Years: 22.00    Pack years: 55.00    Types: Cigarettes    Start date: 43    Quit date: 2005    Years since quitting: 18.4   Smokeless tobacco: Never  Vaping Use   Vaping Use: Never used  Substance and Sexual Activity   Alcohol use: Yes    Alcohol/week: 6.0 standard drinks    Types: 6 Cans of beer per week    Comment: social   Drug use: Not Currently   Sexual activity: Not on file     Physical Exam: Blood pressure 120/76, pulse 74, temperature 98 F (36.7 C), temperature source Oral, height '5\' 8"'$  (1.727 m), weight 156 lb (70.8 kg), SpO2 100 %.  Gen:      No acute distress, hoarse voice ENT:  no nasal polyps, mucus membranes moist Lungs:    tachypnic, coarse rhonchi bilaterally with end expiratory wheezing CV:         Regular rate and rhythm; no murmurs, rubs, or gallops.  No pedal edema   Data Reviewed: Imaging:   PFTs:      View : No data to display.           Labs:  Immunization status: Immunization History  Administered Date(s) Administered   Influenza Whole 03/26/2019   Influenza-Unspecified 04/01/2021   PFIZER Comirnaty(Gray Top)Covid-19 Tri-Sucrose Vaccine 04/01/2019, 03/28/2020    External Records Personally Reviewed: PCP [facility]  Assessment:  Presumed Asthma COPD Overlap Syndrome with acute exacerbation  Plan/Recommendations: Prednisone taper. Albuterol neb in office today.  Resume advair 1 puff bid. Will get pfts at next visit when she is recovered from this.  Will get her a nebulizer machine for home and refill med    Return to Care: Return in about 4 weeks (around 11/26/2021) for 1st  available for PFT and OV with Dr. Shearon Stalls. Lenice Llamas, MD Pulmonary and [place]

## 2021-11-02 DIAGNOSIS — J4531 Mild persistent asthma with (acute) exacerbation: Secondary | ICD-10-CM | POA: Diagnosis not present

## 2021-12-02 ENCOUNTER — Encounter: Payer: Self-pay | Admitting: Internal Medicine

## 2021-12-02 ENCOUNTER — Other Ambulatory Visit (HOSPITAL_COMMUNITY): Payer: Self-pay

## 2021-12-02 ENCOUNTER — Ambulatory Visit (INDEPENDENT_AMBULATORY_CARE_PROVIDER_SITE_OTHER): Payer: 59 | Admitting: Internal Medicine

## 2021-12-02 VITALS — BP 102/78 | HR 70 | Ht 68.0 in | Wt 154.0 lb

## 2021-12-02 DIAGNOSIS — J4541 Moderate persistent asthma with (acute) exacerbation: Secondary | ICD-10-CM | POA: Diagnosis not present

## 2021-12-02 DIAGNOSIS — J301 Allergic rhinitis due to pollen: Secondary | ICD-10-CM

## 2021-12-02 DIAGNOSIS — J449 Chronic obstructive pulmonary disease, unspecified: Secondary | ICD-10-CM | POA: Diagnosis not present

## 2021-12-02 LAB — PULMONARY FUNCTION TEST
DL/VA % pred: 92 %
DL/VA: 3.84 ml/min/mmHg/L
DLCO cor % pred: 84 %
DLCO cor: 20.03 ml/min/mmHg
DLCO unc % pred: 84 %
DLCO unc: 20.03 ml/min/mmHg
FEF 25-75 Post: 2.96 L/sec
FEF 25-75 Pre: 1.75 L/sec
FEF2575-%Change-Post: 69 %
FEF2575-%Pred-Post: 102 %
FEF2575-%Pred-Pre: 60 %
FEV1-%Change-Post: 18 %
FEV1-%Pred-Post: 91 %
FEV1-%Pred-Pre: 76 %
FEV1-Post: 2.85 L
FEV1-Pre: 2.4 L
FEV1FVC-%Change-Post: 11 %
FEV1FVC-%Pred-Pre: 91 %
FEV6-%Change-Post: 6 %
FEV6-%Pred-Post: 90 %
FEV6-%Pred-Pre: 85 %
FEV6-Post: 3.51 L
FEV6-Pre: 3.31 L
FEV6FVC-%Change-Post: 0 %
FEV6FVC-%Pred-Post: 102 %
FEV6FVC-%Pred-Pre: 102 %
FVC-%Change-Post: 6 %
FVC-%Pred-Post: 88 %
FVC-%Pred-Pre: 83 %
FVC-Post: 3.51 L
FVC-Pre: 3.31 L
Post FEV1/FVC ratio: 81 %
Post FEV6/FVC ratio: 100 %
Pre FEV1/FVC ratio: 73 %
Pre FEV6/FVC Ratio: 100 %
RV % pred: 96 %
RV: 1.98 L
TLC % pred: 95 %
TLC: 5.41 L

## 2021-12-02 NOTE — Patient Instructions (Signed)
Full PFT performed today. °

## 2021-12-02 NOTE — Progress Notes (Signed)
Kathy Ramsey    [id number]    Feb 27, 1966  Primary Care Physician:Wharton, Myrtha Mantis Date of Appointment: 12/02/2021 Established Patient Visit  Chief complaint:   Chief Complaint  Patient presents with   Follow-up    Pt reports asthma is better, no attacks or need to use rescue inhaler     HPI: Kathy Ramsey is a 56 y.o. woman with moderate persistent asthma.   Interval Updates: Here for follow up after PFTs. Last time had sick visit and had been non-adherent to advair for some time.we did a prednisone taper for exacerbation and resumed advair. Feeling better since prednisone and resuming advair. No adverse effects or thrush. Here to go over PFTs results.   Her mother died from COPD and she has some apprehension taking prednisone due to side effects from longterm use.   Current Regimen: advair 250 1 puff bid, albuterol Asthma Triggers: weather changes, URIs Exacerbations in the last year: 2 History of hospitalization or intubation: never Allergy Testing: never GERD: denies Allergic Rhinitis: yes. On claritin ACT:  Asthma Control Test ACT Total Score  12/02/2021 10:04 AM 24  10/29/2021  3:37 PM 13  04/20/2019 11:57 AM 8   FeNO:  I have reviewed the patient's family social and past medical history and updated as appropriate.   Past Medical History:  Diagnosis Date   Asthma    adult onset    Past Surgical History:  Procedure Laterality Date   CESAREAN SECTION     COLONOSCOPY     ESOPHAGOGASTRODUODENOSCOPY     MOUTH SURGERY     wisdom teeth    Family History  Problem Relation Age of Onset   Asthma Mother    COPD Mother    Leukemia Mother    Heart failure Mother    Stomach cancer Mother        duodenal   CAD Father    Heart failure Father    Asthma Child    Colon cancer Neg Hx    Diabetes Neg Hx    Rectal cancer Neg Hx     Social History   Occupational History   Not on file  Tobacco Use   Smoking status: Former     Packs/day: 2.50    Years: 22.00    Total pack years: 55.00    Types: Cigarettes    Start date: 89    Quit date: 2005    Years since quitting: 18.5   Smokeless tobacco: Never  Vaping Use   Vaping Use: Never used  Substance and Sexual Activity   Alcohol use: Yes    Alcohol/week: 6.0 standard drinks of alcohol    Types: 6 Cans of beer per week    Comment: social   Drug use: Not Currently   Sexual activity: Not on file     Physical Exam: Blood pressure 102/78, pulse 70, height '5\' 8"'$  (1.727 m), weight 154 lb (69.9 kg), SpO2 98 %.  Gen:      NAD well appearing Lungs:    ctab no increased work of breathing CV:         RRR no mrg   Data Reviewed: Imaging:   PFTs: No airflow limitation with positive BD response.     Latest Ref Rng & Units 12/02/2021    8:59 AM  PFT Results  FVC-Pre L 3.31  P  FVC-Predicted Pre % 83  P  FVC-Post L 3.51  P  FVC-Predicted Post %  88  P  Pre FEV1/FVC % % 73  P  Post FEV1/FCV % % 81  P  FEV1-Pre L 2.40  P  FEV1-Predicted Pre % 76  P  FEV1-Post L 2.85  P  DLCO uncorrected ml/min/mmHg 20.03  P  DLCO UNC% % 84  P  DLCO corrected ml/min/mmHg 20.03  P  DLCO COR %Predicted % 84  P  DLVA Predicted % 92  P  TLC L 5.41  P  TLC % Predicted % 95  P  RV % Predicted % 96  P    P Preliminary result     Labs: Lab Results  Component Value Date   WBC 6.2 11/30/2020   HGB 12.9 11/30/2020   HCT 38.5 11/30/2020   MCV 93.2 11/30/2020   PLT 455 (H) 11/30/2020   Lab Results  Component Value Date   NA 139 11/30/2020   K 4.2 11/30/2020   CL 107 11/30/2020   CO2 20 (L) 11/30/2020    Immunization status: Immunization History  Administered Date(s) Administered   Influenza Whole 03/26/2019   Influenza-Unspecified 04/01/2021   PFIZER Comirnaty(Gray Top)Covid-19 Tri-Sucrose Vaccine 04/01/2019, 03/28/2020    External Records Personally Reviewed: PCP [facility]  Assessment:  Asthma COPD Overlap Syndrome, improved  control  Plan/Recommendations: Continue advair 1 puff bid. Albuterol nebs as needed.     Return to Care: Return in about 6 months (around 06/04/2022).   Lenice Llamas, MD Pulmonary and [place]

## 2021-12-02 NOTE — Progress Notes (Signed)
Full PFT performed today. °

## 2021-12-02 NOTE — Patient Instructions (Addendum)
Please schedule follow up scheduled with myself in 6 months.  If my schedule is not open yet, we will contact you with a reminder closer to that time. Please call [phone] if you haven't heard from [place] a month before.   Keep taking advair 1 puff twice a day, gargle after use.  Continue albuterol as needed.   Call me if any issues or concerns with your breathing and we can certainly get you in sooner.

## 2021-12-03 ENCOUNTER — Other Ambulatory Visit (HOSPITAL_COMMUNITY): Payer: Self-pay

## 2021-12-18 ENCOUNTER — Other Ambulatory Visit (HOSPITAL_COMMUNITY): Payer: Self-pay

## 2022-01-11 ENCOUNTER — Other Ambulatory Visit (HOSPITAL_COMMUNITY): Payer: Self-pay

## 2022-01-12 ENCOUNTER — Other Ambulatory Visit (HOSPITAL_COMMUNITY): Payer: Self-pay

## 2022-01-13 DIAGNOSIS — Z13220 Encounter for screening for lipoid disorders: Secondary | ICD-10-CM | POA: Diagnosis not present

## 2022-01-13 DIAGNOSIS — Z124 Encounter for screening for malignant neoplasm of cervix: Secondary | ICD-10-CM | POA: Diagnosis not present

## 2022-01-13 DIAGNOSIS — J4541 Moderate persistent asthma with (acute) exacerbation: Secondary | ICD-10-CM | POA: Diagnosis not present

## 2022-01-13 DIAGNOSIS — Z0000 Encounter for general adult medical examination without abnormal findings: Secondary | ICD-10-CM | POA: Diagnosis not present

## 2022-01-13 DIAGNOSIS — F329 Major depressive disorder, single episode, unspecified: Secondary | ICD-10-CM | POA: Diagnosis not present

## 2022-02-13 ENCOUNTER — Other Ambulatory Visit (HOSPITAL_COMMUNITY): Payer: Self-pay

## 2022-02-17 ENCOUNTER — Other Ambulatory Visit (HOSPITAL_COMMUNITY): Payer: Self-pay

## 2022-02-20 ENCOUNTER — Other Ambulatory Visit (HOSPITAL_COMMUNITY): Payer: Self-pay

## 2022-02-23 ENCOUNTER — Other Ambulatory Visit (HOSPITAL_COMMUNITY): Payer: Self-pay

## 2022-02-23 MED ORDER — FLUOXETINE HCL 40 MG PO CAPS
40.0000 mg | ORAL_CAPSULE | Freq: Every morning | ORAL | 3 refills | Status: DC
  Filled 2022-02-23: qty 90, 90d supply, fill #0
  Filled 2022-04-18 – 2022-05-20 (×2): qty 90, 90d supply, fill #1
  Filled 2022-09-27 (×2): qty 90, 90d supply, fill #2
  Filled 2023-01-19: qty 90, 90d supply, fill #3

## 2022-02-23 MED ORDER — BUPROPION HCL ER (SR) 100 MG PO TB12
100.0000 mg | ORAL_TABLET | Freq: Every morning | ORAL | 3 refills | Status: DC
  Filled 2022-02-23: qty 90, 90d supply, fill #0
  Filled 2022-04-18 – 2022-05-20 (×2): qty 90, 90d supply, fill #1
  Filled 2022-09-27 (×2): qty 90, 90d supply, fill #2
  Filled 2023-01-19 – 2023-02-20 (×2): qty 90, 90d supply, fill #3

## 2022-02-24 ENCOUNTER — Other Ambulatory Visit (HOSPITAL_COMMUNITY): Payer: Self-pay

## 2022-04-18 ENCOUNTER — Other Ambulatory Visit (HOSPITAL_COMMUNITY): Payer: Self-pay

## 2022-04-19 ENCOUNTER — Other Ambulatory Visit (HOSPITAL_COMMUNITY): Payer: Self-pay

## 2022-05-20 ENCOUNTER — Other Ambulatory Visit: Payer: Self-pay

## 2022-05-20 ENCOUNTER — Other Ambulatory Visit (HOSPITAL_COMMUNITY): Payer: Self-pay

## 2022-07-10 ENCOUNTER — Telehealth: Payer: 59 | Admitting: Nurse Practitioner

## 2022-07-10 DIAGNOSIS — J4541 Moderate persistent asthma with (acute) exacerbation: Secondary | ICD-10-CM | POA: Diagnosis not present

## 2022-07-10 MED ORDER — PREDNISONE 10 MG (21) PO TBPK: ORAL_TABLET | ORAL | 0 refills | Status: DC

## 2022-07-10 NOTE — Patient Instructions (Signed)
Tamala Bari, thank you for joining Chevis Pretty, FNP for today's virtual visit.  While this provider is not your primary care provider (PCP), if your PCP is located in our provider database this encounter information will be shared with them immediately following your visit.   [facility] account gives you access to today's visit and all your visits, tests, and labs performed at [facility] " click here if you don't have a [facility] account or go to [email or web address]  Consent: (Patient) Kathy Ramsey provided verbal consent for this virtual visit at the beginning of the encounter.  Current Medications:  Current Outpatient Medications:    predniSONE (STERAPRED UNI-PAK 21 TAB) 10 MG (21) TBPK tablet, As directed x 6 days, Disp: 21 tablet, Rfl: 0   albuterol (PROAIR HFA) 108 (90 Base) MCG/ACT inhaler, Inhale 1 puff into the lungs every 4 hours as needed, Disp: 18 g, Rfl: 2   albuterol (PROVENTIL) (2.5 MG/3ML) 0.083% nebulizer solution, Inhale 3 mLs (2.5 mg total) by nebulization every 6 (six) hours as needed for wheezing or shortness of breath., Disp: 75 mL, Rfl: 5   albuterol (VENTOLIN HFA) 108 (90 Base) MCG/ACT inhaler, INHALE 2 PUFFS INTO THE LUNGS EVERY 4-6 HOURS AS NEEDED, Disp: 18 g, Rfl: 5   buPROPion ER (WELLBUTRIN SR) 100 MG 12 hr tablet, Take 1 tablet by mouth once daily every morning., Disp: 90 tablet, Rfl: 3   COVID-19 At Home Antigen Test (CARESTART COVID-19 HOME TEST) KIT, Use as directed, Disp: 4 each, Rfl: 0   FLUoxetine (PROZAC) 40 MG capsule, Take 1 capsule by mouth once daily in the morning., Disp: 90 capsule, Rfl: 3   fluticasone-salmeterol (ADVAIR DISKUS) 250-50 MCG/ACT AEPB, Inhale 1 puff into the lungs in the morning and at bedtime., Disp: 60 each, Rfl: 5   hydrocortisone (ANUSOL-HC) 25 MG suppository, Place 1 suppository (25 mg total) rectally 2 (two) times daily., Disp: 12 suppository, Rfl: 0   montelukast  (SINGULAIR) 10 MG tablet, Take 1 tablet (10 mg total) by mouth at bedtime., Disp: 30 tablet, Rfl: 5   Medications ordered in this encounter:  Meds ordered this encounter  Medications   predniSONE (STERAPRED UNI-PAK 21 TAB) 10 MG (21) TBPK tablet    Sig: As directed x 6 days    Dispense:  21 tablet    Refill:  0    Order Specific Question:   Supervising Provider    Answer:   Chase Picket [id number]     *If you need refills on other medications prior to your next appointment, please contact your pharmacy*  Follow-Up: Call back or seek an in-person evaluation if the symptoms worsen or if the condition fails to improve as anticipated.  [place] [phone]  Other Instructions Continue inhalers and neb   If you have been instructed to have an in-person evaluation today at a local Urgent Care facility, please use the link below. It will take you to a list of all of our available [facility], including address, phone number and hours of operation. Please do not delay care.  [facility]  If you or a family member do not have a primary care provider, use the link below to schedule a visit and establish care. When you choose a [facility] primary care physician or advanced practice provider, you gain a long-term partner in health. Find a Primary Care Provider  Learn more about [facility]'s in-office and virtual  care options: [facility]

## 2022-07-10 NOTE — Progress Notes (Signed)
Virtual Visit Consent   Kathy Ramsey, you are scheduled for a virtual visit with Mary-Margaret Hassell Done, [place], a [facility] provider, today.     Just as with appointments in the office, your consent must be obtained to participate.  Your consent will be active for this visit and any virtual visit you may have with one of our providers in the next 365 days.     If you have a MyChart account, a copy of this consent can be sent to you electronically.  All virtual visits are billed to your insurance company just like a traditional visit in the office.    As this is a virtual visit, video technology does not allow for your provider to perform a traditional examination.  This may limit your provider's ability to fully assess your condition.  If your provider identifies any concerns that need to be evaluated in person or the need to arrange testing (such as labs, EKG, etc.), we will make arrangements to do so.     Although advances in technology are sophisticated, we cannot ensure that it will always work on either your end or our end.  If the connection with a video visit is poor, the visit may have to be switched to a telephone visit.  With either a video or telephone visit, we are not always able to ensure that we have a secure connection.     I need to obtain your verbal consent now.   Are you willing to proceed with your visit today? YES   Kathy Ramsey has provided verbal consent on 07/10/2022 for a virtual visit (video or telephone).   Mary-Margaret Hassell Done, FNP   Date: 07/10/2022 4:07 PM   Virtual Visit via Video Note   I, Mary-Margaret Hassell Done, connected with Kathy Ramsey ([id number], Nov 07, 1965) on 07/10/22 at  4:15 PM EST by a video-enabled telemedicine application and verified that I am speaking with the correct person using two identifiers.  Location: Patient: Virtual Visit Location Patient: Home Provider: Virtual Visit Location Provider: Mobile   I discussed the  limitations of evaluation and management by telemedicine and the availability of in person appointments. The patient expressed understanding and agreed to proceed.    History of Present Illness: Kathy Ramsey is a 57 y.o. who identifies as a female who was assigned female at birth, and is being seen today for asthma.  HPI: Asthma She complains of chest tightness, cough, difficulty breathing, hoarse voice, shortness of breath and sputum production (slight). This is a new problem. The current episode started in the past 7 days. The problem has been gradually worsening. The cough is productive of sputum. Pertinent negatives include no nasal congestion. Her symptoms are aggravated by nothing. Her symptoms are alleviated by leukotriene antagonist (advair, albuterol, albuterol neb). She reports moderate improvement on treatment. Her past medical history is significant for asthma.    Review of Systems  HENT:  Positive for hoarse voice.   Respiratory:  Positive for cough, sputum production (slight) and shortness of breath.     Problems: There are no problems to display for this patient.   Allergies: No Known Allergies Medications:  Current Outpatient Medications:    albuterol (PROAIR HFA) 108 (90 Base) MCG/ACT inhaler, Inhale 1 puff into the lungs every 4 hours as needed, Disp: 18 g, Rfl: 2   albuterol (PROVENTIL) (2.5 MG/3ML) 0.083% nebulizer solution, Inhale 3 mLs (2.5 mg total) by nebulization every 6 (six) hours as needed for wheezing or  shortness of breath., Disp: 75 mL, Rfl: 5   albuterol (VENTOLIN HFA) 108 (90 Base) MCG/ACT inhaler, INHALE 2 PUFFS INTO THE LUNGS EVERY 4-6 HOURS AS NEEDED, Disp: 18 g, Rfl: 5   buPROPion ER (WELLBUTRIN SR) 100 MG 12 hr tablet, Take 1 tablet by mouth once daily every morning., Disp: 90 tablet, Rfl: 3   COVID-19 At Home Antigen Test (CARESTART COVID-19 HOME TEST) KIT, Use as directed, Disp: 4 each, Rfl: 0   FLUoxetine (PROZAC) 40 MG capsule, Take 1 capsule by  mouth once daily in the morning., Disp: 90 capsule, Rfl: 3   fluticasone-salmeterol (ADVAIR DISKUS) 250-50 MCG/ACT AEPB, Inhale 1 puff into the lungs in the morning and at bedtime., Disp: 60 each, Rfl: 5   hydrocortisone (ANUSOL-HC) 25 MG suppository, Place 1 suppository (25 mg total) rectally 2 (two) times daily., Disp: 12 suppository, Rfl: 0   montelukast (SINGULAIR) 10 MG tablet, Take 1 tablet (10 mg total) by mouth at bedtime., Disp: 30 tablet, Rfl: 5  Observations/Objective: Patient is well-developed, well-nourished in no acute distress.  Resting comfortably  at home.  Head is normocephalic, atraumatic.  No labored breathing.  Speech is clear and coherent with logical content.  Patient is alert and oriented at baseline.    Assessment and Plan:  Kathy Ramsey in today with chief complaint of Asthma   1. Moderate persistent asthma with exacerbation Force fluids Rest Run humidifier Continue inhalers and neb - predniSONE (STERAPRED UNI-PAK 21 TAB) 10 MG (21) TBPK tablet; As directed x 6 days  Dispense: 21 tablet; Refill: 0    Follow Up Instructions: I discussed the assessment and treatment plan with the patient. The patient was provided an opportunity to ask questions and all were answered. The patient agreed with the plan and demonstrated an understanding of the instructions.  A copy of instructions were sent to the patient via MyChart.  The patient was advised to call back or seek an in-person evaluation if the symptoms worsen or if the condition fails to improve as anticipated.  Time:  I spent 17 minutes with the patient via telehealth technology discussing the above problems/concerns.    Mary-Margaret Hassell Done, FNP

## 2022-07-14 ENCOUNTER — Other Ambulatory Visit: Payer: Self-pay | Admitting: Internal Medicine

## 2022-07-14 ENCOUNTER — Other Ambulatory Visit: Payer: Self-pay

## 2022-07-15 ENCOUNTER — Other Ambulatory Visit: Payer: Self-pay

## 2022-07-15 ENCOUNTER — Other Ambulatory Visit (HOSPITAL_COMMUNITY): Payer: Self-pay

## 2022-07-15 ENCOUNTER — Encounter (HOSPITAL_COMMUNITY): Payer: Self-pay | Admitting: Pharmacist

## 2022-07-16 ENCOUNTER — Other Ambulatory Visit (HOSPITAL_COMMUNITY): Payer: Self-pay

## 2022-07-16 ENCOUNTER — Encounter (HOSPITAL_COMMUNITY): Payer: Self-pay

## 2022-07-16 ENCOUNTER — Other Ambulatory Visit: Payer: Self-pay

## 2022-07-16 MED ORDER — MONTELUKAST SODIUM 10 MG PO TABS
10.0000 mg | ORAL_TABLET | Freq: Every day | ORAL | 5 refills | Status: DC
  Filled 2022-07-16: qty 30, 30d supply, fill #0
  Filled 2022-09-27 (×2): qty 30, 30d supply, fill #1
  Filled 2022-11-15: qty 90, 90d supply, fill #2
  Filled 2023-01-19: qty 90, 90d supply, fill #3
  Filled 2023-02-20: qty 30, 30d supply, fill #3

## 2022-07-19 ENCOUNTER — Other Ambulatory Visit (HOSPITAL_COMMUNITY): Payer: Self-pay

## 2022-07-20 ENCOUNTER — Other Ambulatory Visit: Payer: Self-pay

## 2022-08-02 ENCOUNTER — Encounter: Payer: Self-pay | Admitting: Internal Medicine

## 2022-08-02 ENCOUNTER — Ambulatory Visit (INDEPENDENT_AMBULATORY_CARE_PROVIDER_SITE_OTHER): Payer: 59 | Admitting: Internal Medicine

## 2022-08-02 VITALS — BP 120/74 | HR 82 | Temp 98.0°F | Ht 69.0 in | Wt 164.4 lb

## 2022-08-02 DIAGNOSIS — J4540 Moderate persistent asthma, uncomplicated: Secondary | ICD-10-CM

## 2022-08-02 NOTE — Patient Instructions (Signed)
Please schedule follow up scheduled with myself in 6 months.  If my schedule is not open yet, we will contact you with a reminder closer to that time. Please call [phone] if you haven't heard from [place] a month before.   Continue advair and albuterol  You can switch to cetirizine anti-histamine for allergies.  Let me know if we need to try something else.   Can increase advair to 2 puffs twice a day if you feel an asthma flare coming on or were exposed to a trigger.

## 2022-08-02 NOTE — Progress Notes (Signed)
Kathy Ramsey    [id number]    [birth year]  Primary Care Physician:Wharton, Myrtha Mantis Date of Appointment: 08/02/2022 Established Patient Visit  Chief complaint:   Chief Complaint  Patient presents with   Follow-up    No c/o      HPI: Kathy Ramsey is a 57 y.o. woman with moderate persistent asthma copd overlap syndrome.   Interval Updates: Here for follow up.  Had prednisone course x 1 from PCP in Feb 2024. Feels trigger was weather related. Currently feeling well on advair maintenance.   Current Regimen: advair 250 1 puff bid, albuterol Asthma Triggers: weather changes, URIs Exacerbations in the last year: 2 since June 2023.  History of hospitalization or intubation: never Allergy Testing: never GERD: denies Allergic Rhinitis: yes. On claritin ACT:  Asthma Control Test ACT Total Score  08/02/2022 11:18 AM 24  12/02/2021 10:04 AM 24  10/29/2021  3:37 PM 13   FeNO:  I have reviewed the patient's family social and past medical history and updated as appropriate.   Past Medical History:  Diagnosis Date   Asthma    adult onset    Past Surgical History:  Procedure Laterality Date   CESAREAN SECTION     COLONOSCOPY     ESOPHAGOGASTRODUODENOSCOPY     MOUTH SURGERY     wisdom teeth    Family History  Problem Relation Age of Onset   Asthma Mother    COPD Mother    Leukemia Mother    Heart failure Mother    Stomach cancer Mother        duodenal   CAD Father    Heart failure Father    Asthma Child    Colon cancer Neg Hx    Diabetes Neg Hx    Rectal cancer Neg Hx     Social History   Occupational History   Not on file  Tobacco Use   Smoking status: Former    Packs/day: 2.50    Years: 22.00    Total pack years: 55.00    Types: Cigarettes    Start date: 11    Quit date: 2005    Years since quitting: 19.1   Smokeless tobacco: Never  Vaping Use   Vaping Use: Never used  Substance and Sexual Activity   Alcohol use: Yes     Alcohol/week: 6.0 standard drinks of alcohol    Types: 6 Cans of beer per week    Comment: social   Drug use: Not Currently   Sexual activity: Not on file     Physical Exam: Blood pressure 120/74, pulse 82, temperature 98 F (36.7 C), temperature source Oral, height '5\' 9"'$  (1.753 m), weight 164 lb 6.4 oz (74.6 kg), SpO2 99 %.  Gen:      NAD well appearing Lungs:    ctab no increased work of breathing CV:         RRR no mrg   Data Reviewed: Imaging:   PFTs: No airflow limitation with positive BD response.     Latest Ref Rng & Units 12/02/2021    8:59 AM  PFT Results  FVC-Pre L 3.31   FVC-Predicted Pre % 83   FVC-Post L 3.51   FVC-Predicted Post % 88   Pre FEV1/FVC % % 73   Post FEV1/FCV % % 81   FEV1-Pre L 2.40   FEV1-Predicted Pre % 76   FEV1-Post L 2.85   DLCO uncorrected ml/min/mmHg 20.03  DLCO UNC% % 84   DLCO corrected ml/min/mmHg 20.03   DLCO COR %Predicted % 84   DLVA Predicted % 92   TLC L 5.41   TLC % Predicted % 95   RV % Predicted % 96      Labs: Lab Results  Component Value Date   WBC 6.2 11/30/2020   HGB 12.9 11/30/2020   HCT 38.5 11/30/2020   MCV 93.2 11/30/2020   PLT 455 (H) 11/30/2020   Lab Results  Component Value Date   NA 139 11/30/2020   K 4.2 11/30/2020   CL 107 11/30/2020   CO2 20 (L) 11/30/2020    Immunization status: Immunization History  Administered Date(s) Administered   Influenza Whole 03/26/2019   Influenza-Unspecified 04/01/2021, 02/10/2022   PFIZER Comirnaty(Gray Top)Covid-19 Tri-Sucrose Vaccine 04/01/2019, 03/28/2020    External Records Personally Reviewed: PCP [facility]  Assessment:  Asthma COPD Overlap Syndrome, improved control  Plan/Recommendations: continue advair 1 puff bid. Albuterol nebs as needed.     Return to Care: Return in about 6 months (around 02/02/2023).   Lenice Llamas, MD Pulmonary and [place]

## 2022-09-27 ENCOUNTER — Other Ambulatory Visit: Payer: Self-pay

## 2022-09-27 ENCOUNTER — Other Ambulatory Visit (HOSPITAL_COMMUNITY): Payer: Self-pay

## 2022-09-27 DIAGNOSIS — Z7982 Long term (current) use of aspirin: Secondary | ICD-10-CM | POA: Diagnosis not present

## 2022-09-27 DIAGNOSIS — Z79899 Other long term (current) drug therapy: Secondary | ICD-10-CM | POA: Diagnosis not present

## 2022-09-27 DIAGNOSIS — I4719 Other supraventricular tachycardia: Secondary | ICD-10-CM | POA: Diagnosis not present

## 2022-09-27 DIAGNOSIS — R002 Palpitations: Secondary | ICD-10-CM | POA: Diagnosis not present

## 2022-09-27 DIAGNOSIS — I471 Supraventricular tachycardia, unspecified: Secondary | ICD-10-CM | POA: Diagnosis not present

## 2022-09-28 DIAGNOSIS — R002 Palpitations: Secondary | ICD-10-CM | POA: Diagnosis not present

## 2022-09-28 DIAGNOSIS — Z7982 Long term (current) use of aspirin: Secondary | ICD-10-CM | POA: Diagnosis not present

## 2022-09-28 DIAGNOSIS — I471 Supraventricular tachycardia, unspecified: Secondary | ICD-10-CM | POA: Diagnosis not present

## 2022-09-28 DIAGNOSIS — Z79899 Other long term (current) drug therapy: Secondary | ICD-10-CM | POA: Diagnosis not present

## 2022-09-28 DIAGNOSIS — I4719 Other supraventricular tachycardia: Secondary | ICD-10-CM | POA: Diagnosis not present

## 2022-11-08 ENCOUNTER — Other Ambulatory Visit: Payer: Self-pay | Admitting: Internal Medicine

## 2022-11-09 ENCOUNTER — Other Ambulatory Visit (HOSPITAL_COMMUNITY): Payer: Self-pay

## 2022-11-10 ENCOUNTER — Other Ambulatory Visit (HOSPITAL_COMMUNITY): Payer: Self-pay

## 2022-11-11 ENCOUNTER — Other Ambulatory Visit (HOSPITAL_COMMUNITY): Payer: Self-pay

## 2022-11-11 MED ORDER — ALBUTEROL SULFATE HFA 108 (90 BASE) MCG/ACT IN AERS
2.0000 | INHALATION_SPRAY | RESPIRATORY_TRACT | 5 refills | Status: DC | PRN
  Filled 2022-11-11: qty 6.7, 16d supply, fill #0
  Filled 2023-01-07: qty 6.7, 16d supply, fill #1
  Filled 2023-01-19: qty 6.7, 16d supply, fill #2
  Filled 2023-03-27: qty 6.7, 16d supply, fill #3
  Filled 2023-06-25: qty 6.7, 16d supply, fill #4
  Filled 2023-08-24: qty 6.7, 16d supply, fill #5

## 2022-11-15 ENCOUNTER — Other Ambulatory Visit (HOSPITAL_COMMUNITY): Payer: Self-pay

## 2022-11-15 ENCOUNTER — Other Ambulatory Visit: Payer: Self-pay | Admitting: Internal Medicine

## 2022-11-16 ENCOUNTER — Other Ambulatory Visit: Payer: Self-pay

## 2022-11-18 ENCOUNTER — Other Ambulatory Visit (HOSPITAL_COMMUNITY): Payer: Self-pay

## 2022-11-18 ENCOUNTER — Other Ambulatory Visit: Payer: Self-pay

## 2022-11-18 MED ORDER — FLUTICASONE-SALMETEROL 250-50 MCG/ACT IN AEPB
1.0000 | INHALATION_SPRAY | Freq: Two times a day (BID) | RESPIRATORY_TRACT | 5 refills | Status: DC
  Filled 2022-11-18: qty 60, 30d supply, fill #0
  Filled 2023-01-19: qty 60, 30d supply, fill #1
  Filled 2023-02-20: qty 60, 30d supply, fill #2
  Filled 2023-03-27: qty 60, 30d supply, fill #3
  Filled 2023-06-25: qty 60, 30d supply, fill #4
  Filled 2023-08-22: qty 60, 30d supply, fill #5

## 2022-11-24 DIAGNOSIS — Z9889 Other specified postprocedural states: Secondary | ICD-10-CM | POA: Diagnosis not present

## 2022-11-24 DIAGNOSIS — Z8679 Personal history of other diseases of the circulatory system: Secondary | ICD-10-CM | POA: Diagnosis not present

## 2022-11-24 DIAGNOSIS — I471 Supraventricular tachycardia, unspecified: Secondary | ICD-10-CM | POA: Diagnosis not present

## 2023-01-07 ENCOUNTER — Other Ambulatory Visit (HOSPITAL_COMMUNITY): Payer: Self-pay

## 2023-01-19 ENCOUNTER — Other Ambulatory Visit: Payer: Self-pay

## 2023-01-19 ENCOUNTER — Other Ambulatory Visit (HOSPITAL_COMMUNITY): Payer: Self-pay

## 2023-01-19 DIAGNOSIS — Z0000 Encounter for general adult medical examination without abnormal findings: Secondary | ICD-10-CM | POA: Diagnosis not present

## 2023-01-19 DIAGNOSIS — Z23 Encounter for immunization: Secondary | ICD-10-CM | POA: Diagnosis not present

## 2023-01-19 DIAGNOSIS — J4541 Moderate persistent asthma with (acute) exacerbation: Secondary | ICD-10-CM | POA: Diagnosis not present

## 2023-01-19 DIAGNOSIS — F329 Major depressive disorder, single episode, unspecified: Secondary | ICD-10-CM | POA: Diagnosis not present

## 2023-01-19 DIAGNOSIS — Z6824 Body mass index (BMI) 24.0-24.9, adult: Secondary | ICD-10-CM | POA: Diagnosis not present

## 2023-01-19 DIAGNOSIS — Z13220 Encounter for screening for lipoid disorders: Secondary | ICD-10-CM | POA: Diagnosis not present

## 2023-01-19 MED ORDER — BUPROPION HCL ER (SR) 150 MG PO TB12
150.0000 mg | ORAL_TABLET | Freq: Every morning | ORAL | 0 refills | Status: DC
  Filled 2023-01-19: qty 90, 90d supply, fill #0

## 2023-01-22 ENCOUNTER — Ambulatory Visit (HOSPITAL_BASED_OUTPATIENT_CLINIC_OR_DEPARTMENT_OTHER)
Admission: RE | Admit: 2023-01-22 | Discharge: 2023-01-22 | Disposition: A | Discharge status: Home / Self Care | Payer: 59 | Source: Ambulatory Visit | Attending: Family Medicine | Admitting: Family Medicine

## 2023-01-22 DIAGNOSIS — Z1231 Encounter for screening mammogram for malignant neoplasm of breast: Secondary | ICD-10-CM | POA: Diagnosis not present

## 2023-02-16 DIAGNOSIS — H524 Presbyopia: Secondary | ICD-10-CM | POA: Diagnosis not present

## 2023-02-20 ENCOUNTER — Other Ambulatory Visit (HOSPITAL_COMMUNITY): Payer: Self-pay

## 2023-02-21 ENCOUNTER — Other Ambulatory Visit: Payer: Self-pay

## 2023-03-27 ENCOUNTER — Other Ambulatory Visit: Payer: Self-pay | Admitting: Internal Medicine

## 2023-03-28 ENCOUNTER — Other Ambulatory Visit (HOSPITAL_COMMUNITY): Payer: Self-pay

## 2023-03-28 ENCOUNTER — Other Ambulatory Visit: Payer: Self-pay

## 2023-03-28 MED ORDER — MONTELUKAST SODIUM 10 MG PO TABS
10.0000 mg | ORAL_TABLET | Freq: Every day | ORAL | 5 refills | Status: DC
  Filled 2023-03-28: qty 30, 30d supply, fill #0
  Filled 2023-06-25: qty 30, 30d supply, fill #1
  Filled 2023-08-22: qty 30, 30d supply, fill #2

## 2023-06-25 ENCOUNTER — Other Ambulatory Visit (HOSPITAL_COMMUNITY): Payer: Self-pay

## 2023-06-27 ENCOUNTER — Other Ambulatory Visit (HOSPITAL_COMMUNITY): Payer: Self-pay

## 2023-06-27 ENCOUNTER — Other Ambulatory Visit: Payer: Self-pay

## 2023-06-27 ENCOUNTER — Other Ambulatory Visit (HOSPITAL_BASED_OUTPATIENT_CLINIC_OR_DEPARTMENT_OTHER): Payer: Self-pay

## 2023-06-27 MED ORDER — BUPROPION HCL ER (SR) 150 MG PO TB12
150.0000 mg | ORAL_TABLET | Freq: Every morning | ORAL | 1 refills | Status: DC
  Filled 2023-06-27: qty 90, 90d supply, fill #0
  Filled 2023-08-22 – 2023-09-20 (×2): qty 90, 90d supply, fill #1

## 2023-06-27 MED ORDER — FLUOXETINE HCL 40 MG PO CAPS
40.0000 mg | ORAL_CAPSULE | Freq: Every day | ORAL | 2 refills | Status: DC
  Filled 2023-06-27: qty 90, 90d supply, fill #0
  Filled 2023-09-20: qty 90, 90d supply, fill #1
  Filled 2024-01-20: qty 90, 90d supply, fill #2

## 2023-07-07 ENCOUNTER — Other Ambulatory Visit (HOSPITAL_COMMUNITY): Payer: Self-pay

## 2023-08-22 ENCOUNTER — Other Ambulatory Visit: Payer: Self-pay

## 2023-08-22 ENCOUNTER — Other Ambulatory Visit (HOSPITAL_COMMUNITY): Payer: Self-pay

## 2023-08-24 ENCOUNTER — Other Ambulatory Visit (HOSPITAL_COMMUNITY): Payer: Self-pay

## 2023-08-31 ENCOUNTER — Ambulatory Visit: Admitting: Pulmonary Disease

## 2023-09-01 ENCOUNTER — Other Ambulatory Visit (HOSPITAL_COMMUNITY): Payer: Self-pay

## 2023-09-01 ENCOUNTER — Encounter: Payer: Self-pay | Admitting: Internal Medicine

## 2023-09-01 ENCOUNTER — Other Ambulatory Visit: Payer: Self-pay

## 2023-09-01 ENCOUNTER — Ambulatory Visit (INDEPENDENT_AMBULATORY_CARE_PROVIDER_SITE_OTHER): Admitting: Internal Medicine

## 2023-09-01 VITALS — BP 100/82 | HR 98 | Ht 68.0 in | Wt 169.0 lb

## 2023-09-01 DIAGNOSIS — J4541 Moderate persistent asthma with (acute) exacerbation: Secondary | ICD-10-CM

## 2023-09-01 DIAGNOSIS — Z87891 Personal history of nicotine dependence: Secondary | ICD-10-CM

## 2023-09-01 DIAGNOSIS — J301 Allergic rhinitis due to pollen: Secondary | ICD-10-CM

## 2023-09-01 DIAGNOSIS — J4489 Other specified chronic obstructive pulmonary disease: Secondary | ICD-10-CM

## 2023-09-01 LAB — CBC WITH DIFFERENTIAL/PLATELET
Basophils Absolute: 0 10*3/uL (ref 0.0–0.1)
Basophils Relative: 0.7 % (ref 0.0–3.0)
Eosinophils Absolute: 1.3 10*3/uL — ABNORMAL HIGH (ref 0.0–0.7)
Eosinophils Relative: 22.2 % — ABNORMAL HIGH (ref 0.0–5.0)
HCT: 41.3 % (ref 36.0–46.0)
Hemoglobin: 13.8 g/dL (ref 12.0–15.0)
Lymphocytes Relative: 31.8 % (ref 12.0–46.0)
Lymphs Abs: 1.8 10*3/uL (ref 0.7–4.0)
MCHC: 33.4 g/dL (ref 30.0–36.0)
MCV: 93.3 fl (ref 78.0–100.0)
Monocytes Absolute: 0.5 10*3/uL (ref 0.1–1.0)
Monocytes Relative: 8.7 % (ref 3.0–12.0)
Neutro Abs: 2.1 10*3/uL (ref 1.4–7.7)
Neutrophils Relative %: 36.6 % — ABNORMAL LOW (ref 43.0–77.0)
Platelets: 399 10*3/uL (ref 150.0–400.0)
RBC: 4.43 Mil/uL (ref 3.87–5.11)
RDW: 13.5 % (ref 11.5–15.5)
WBC: 5.7 10*3/uL (ref 4.0–10.5)

## 2023-09-01 MED ORDER — AZELASTINE-FLUTICASONE 137-50 MCG/ACT NA SUSP
1.0000 | Freq: Every day | NASAL | 11 refills | Status: DC
  Filled 2023-09-01 – 2023-11-16 (×5): qty 23, 30d supply, fill #0

## 2023-09-01 MED ORDER — MONTELUKAST SODIUM 10 MG PO TABS
10.0000 mg | ORAL_TABLET | Freq: Every day | ORAL | 11 refills | Status: AC
  Filled 2023-09-01 – 2023-09-20 (×2): qty 30, 30d supply, fill #0
  Filled 2023-10-26: qty 30, 30d supply, fill #1
  Filled 2023-12-10: qty 90, 90d supply, fill #2
  Filled 2024-03-19: qty 90, 90d supply, fill #3
  Filled 2024-06-13: qty 90, 90d supply, fill #4

## 2023-09-01 MED ORDER — LEVOCETIRIZINE DIHYDROCHLORIDE 5 MG PO TABS
5.0000 mg | ORAL_TABLET | Freq: Every evening | ORAL | 11 refills | Status: AC
  Filled 2023-09-01 (×2): qty 30, 30d supply, fill #0
  Filled 2023-09-24 – 2023-09-26 (×2): qty 30, 30d supply, fill #1
  Filled 2023-10-26: qty 30, 30d supply, fill #2
  Filled 2023-11-16: qty 30, 30d supply, fill #3
  Filled 2023-12-10: qty 90, 90d supply, fill #3
  Filled 2024-01-20 – 2024-03-19 (×2): qty 90, 90d supply, fill #4
  Filled 2024-06-13: qty 90, 90d supply, fill #5

## 2023-09-01 MED ORDER — PREDNISONE 10 MG PO TABS
ORAL_TABLET | ORAL | 0 refills | Status: AC
  Filled 2023-09-01 (×2): qty 30, 12d supply, fill #0

## 2023-09-01 MED ORDER — FLUTICASONE-SALMETEROL 250-50 MCG/ACT IN AEPB
1.0000 | INHALATION_SPRAY | Freq: Two times a day (BID) | RESPIRATORY_TRACT | 11 refills | Status: AC
  Filled 2023-09-01 – 2023-09-20 (×2): qty 60, 30d supply, fill #0
  Filled 2023-12-10: qty 60, 30d supply, fill #1
  Filled 2024-01-20: qty 60, 30d supply, fill #2
  Filled 2024-03-19: qty 180, 90d supply, fill #3
  Filled 2024-06-13: qty 180, 90d supply, fill #4

## 2023-09-01 NOTE — Patient Instructions (Addendum)
 It was a pleasure to see you today!  Please schedule follow up with myself in 6 months.  If my schedule is not open yet, we will contact you with a reminder closer to that time. Please call [phone] if you haven't heard from [place] a month before, and always call us sooner if issues or concerns arise. You can also send [place] a message through MyChart, but but aware that this is not to be used for urgent issues and it may take up to 5-7 days to receive a reply. Please be aware that you will likely be able to view your results before I have a chance to respond to them. Please give [place] 5 business days to respond to any non-urgent results.    Before your next visit I would like you to have: Blood work today for allergies - can get this in the lobby  Sorry your asthma is not doing well.  I have sent a prednisone taper to your pharmacy.  Continue the Advair as you are doing and the nebulizer treatments as needed up to 4 times a day.  Stop the Afrin.  Instead switch to Dymista nasal spray which I sent to your pharmacy.  You can also stop the Claritin and we will try Xyzal instead for your antihistamine.

## 2023-09-01 NOTE — Progress Notes (Signed)
 Kathy Ramsey    [id number]    1965/10/06  Primary Care Physician:Wharton, Rulon Eisenmenger Date of Appointment: 09/01/2023 Established Patient Visit  Chief complaint:   Chief Complaint  Patient presents with   Follow-up    Asthma out of control     HPI: Kathy Ramsey is a 58 y.o. woman with moderate persistent asthma copd overlap syndrome.   Interval Updates: Here for follow up after one year.  No interval courses of prednisone for breathing.  Here not feeling well. Wheezing, weakness, productive cough yellowish phlegm. Hoarse voice. Symptoms started 1-2 months ago and first got better, then got worse.   Using nebulizer treatment 2-3 times/day now having sinus congestion and on affrin as needed. No reflux.   Cetirizine didn't help her as much in the past.    Current Regimen: advair 250 1 puff bid, albuterol Asthma Triggers: weather changes, URIs Exacerbations in the last year: 0 since April 2024 History of hospitalization or intubation: never Allergy Testing: never GERD: denies Allergic Rhinitis: yes. On claritin and montelukast ACT:  Asthma Control Test ACT Total Score  09/01/2023  9:04 AM 6  08/02/2022 11:18 AM 24  12/02/2021 10:04 AM 24   FeNO:  I have reviewed the patient's family social and past medical history and updated as appropriate.   Past Medical History:  Diagnosis Date   Asthma    adult onset    Past Surgical History:  Procedure Laterality Date   CESAREAN SECTION     COLONOSCOPY     ESOPHAGOGASTRODUODENOSCOPY     MOUTH SURGERY     wisdom teeth    Family History  Problem Relation Age of Onset   Asthma Mother    COPD Mother    Leukemia Mother    Heart failure Mother    Stomach cancer Mother        duodenal   CAD Father    Heart failure Father    Asthma Child    Colon cancer Neg Hx    Diabetes Neg Hx    Rectal cancer Neg Hx     Social History   Occupational History   Not on file  Tobacco Use   Smoking status:  Former    Current packs/day: 0.00    Average packs/day: 2.5 packs/day for 22.0 years (55.0 ttl pk-yrs)    Types: Cigarettes    Start date: 18    Quit date: 2005    Years since quitting: 20.2   Smokeless tobacco: Never  Vaping Use   Vaping status: Never Used  Substance and Sexual Activity   Alcohol use: Yes    Alcohol/week: 6.0 standard drinks of alcohol    Types: 6 Cans of beer per week    Comment: social   Drug use: Not Currently   Sexual activity: Not on file     Physical Exam: Blood pressure 100/82, pulse 98, height 5\' 8"  (1.727 m), weight 169 lb (76.7 kg), SpO2 94%.  Gen:      NAD well appearing Lungs:    ctab no increased work of breathing CV:         RRR no mrg   Data Reviewed: Imaging:   PFTs: No airflow limitation with positive BD response.     Latest Ref Rng & Units 12/02/2021    8:59 AM  PFT Results  FVC-Pre L 3.31   FVC-Predicted Pre % 83   FVC-Post L 3.51   FVC-Predicted Post % 88  Pre FEV1/FVC % % 73   Post FEV1/FCV % % 81   FEV1-Pre L 2.40   FEV1-Predicted Pre % 76   FEV1-Post L 2.85   DLCO uncorrected ml/min/mmHg 20.03   DLCO UNC% % 84   DLCO corrected ml/min/mmHg 20.03   DLCO COR %Predicted % 84   DLVA Predicted % 92   TLC L 5.41   TLC % Predicted % 95   RV % Predicted % 96      Labs: Lab Results  Component Value Date   WBC 6.2 11/30/2020   HGB 12.9 11/30/2020   HCT 38.5 11/30/2020   MCV 93.2 11/30/2020   PLT 455 (H) 11/30/2020   Lab Results  Component Value Date   NA 139 11/30/2020   K 4.2 11/30/2020   CL 107 11/30/2020   CO2 20 (L) 11/30/2020    Immunization status: Immunization History  Administered Date(s) Administered   Influenza Whole 03/26/2019   Influenza-Unspecified 04/01/2021, 02/10/2022   PFIZER Comirnaty(Gray Top)Covid-19 Tri-Sucrose Vaccine 04/01/2019, 03/28/2020    External Records Personally Reviewed: PCP [facility]aha Sprang  Assessment:  Asthma COPD Overlap Syndrome,with acute exacerbation Seasonal allergic  rhinitis, not well controlled  Plan/Recommendations: Blood work today for allergies - cbc with diff and region 2 allergy panel  Sorry your asthma is not doing well.  I have sent a prednisone taper to your pharmacy.  Continue the Advair as you are doing and the nebulizer treatments as needed up to 4 times a day.  Stop the Afrin.  Instead switch to Dymista nasal spray which I sent to your pharmacy.  You can also stop the Claritin and we will try Xyzal instead for your antihistamine.   Return to Care: Return in about 6 months (around 03/02/2024).   Durel Salts, MD Pulmonary and Critical Care Medicine [facility] [phone]

## 2023-09-02 ENCOUNTER — Encounter: Payer: Self-pay | Admitting: Internal Medicine

## 2023-09-05 LAB — RESPIRATORY ALLERGY PROFILE REGION II ~~LOC~~
Allergen, A. alternata, m6: 1.56 kU/L — ABNORMAL HIGH
Allergen, Cedar tree, t12: 0.91 kU/L — ABNORMAL HIGH
Allergen, Comm Silver Birch, t9: <0.1 kU/L
Allergen, Cottonwood, t14: <0.1 kU/L
Allergen, D pternoyssinus,d7: <0.1 kU/L
Allergen, Mouse Urine Protein, e78: <0.1 kU/L
Allergen, Mulberry, t76: <0.1 kU/L
Allergen, Oak,t7: <0.1 kU/L
Allergen, P. notatum, m1: <0.1 kU/L
Aspergillus fumigatus, m3: <0.1 kU/L
Bermuda Grass: <0.1 kU/L
Box Elder IgE: <0.1 kU/L
CLADOSPORIUM HERBARUM (M2) IGE: <0.1 kU/L
COMMON RAGWEED (SHORT) (W1) IGE: <0.1 kU/L
Cat Dander: <0.1 kU/L
Class: 0
Class: 0
Class: 0
Class: 0
Class: 0
Class: 0
Class: 0
Class: 0
Class: 0
Class: 0
Class: 0
Class: 0
Class: 0
Class: 0
Class: 0
Class: 0
Class: 0
Class: 0
Class: 0
Class: 0
Class: 2
Class: 2
Class: 2
Cockroach: <0.1 kU/L
D. farinae: <0.1 kU/L
Dog Dander: 0.31 kU/L — ABNORMAL HIGH
Elm IgE: <0.1 kU/L
IgE (Immunoglobulin E), Serum: 73 kU/L (ref <=114)
Johnson Grass: <0.1 kU/L
Pecan/Hickory Tree IgE: <0.1 kU/L
Rough Pigweed  IgE: <0.1 kU/L
Sheep Sorrel IgE: <0.1 kU/L
Timothy Grass: 2.61 kU/L — ABNORMAL HIGH

## 2023-09-05 LAB — DOG DANDER COMPONENT
Can f 4(e229) IgE: 0.17 kU/L — ABNORMAL HIGH (ref <0.10)
Can f 6(e230) IgE: <0.1 kU/L (ref <0.10)
E101-IgE Can f 1: <0.1 kU/L (ref <0.10)
E102-IgE Can f 2: <0.1 kU/L (ref <0.10)
E221-IgE Can f 3: <0.1 kU/L (ref <0.10)
E226-IgE Can f 5: 0.27 kU/L — ABNORMAL HIGH (ref <0.10)

## 2023-09-05 LAB — INTERPRETATION:

## 2023-09-20 ENCOUNTER — Other Ambulatory Visit: Payer: Self-pay

## 2023-09-20 ENCOUNTER — Other Ambulatory Visit (HOSPITAL_COMMUNITY): Payer: Self-pay

## 2023-09-24 ENCOUNTER — Other Ambulatory Visit (HOSPITAL_COMMUNITY): Payer: Self-pay

## 2023-10-26 ENCOUNTER — Other Ambulatory Visit: Payer: Self-pay

## 2023-10-26 ENCOUNTER — Other Ambulatory Visit (HOSPITAL_COMMUNITY): Payer: Self-pay

## 2023-11-03 ENCOUNTER — Other Ambulatory Visit: Payer: Self-pay

## 2023-11-03 ENCOUNTER — Other Ambulatory Visit (HOSPITAL_COMMUNITY): Payer: Self-pay

## 2023-11-05 ENCOUNTER — Other Ambulatory Visit (HOSPITAL_COMMUNITY): Payer: Self-pay

## 2023-11-09 ENCOUNTER — Other Ambulatory Visit (HOSPITAL_COMMUNITY): Payer: Self-pay

## 2023-11-10 ENCOUNTER — Other Ambulatory Visit (HOSPITAL_COMMUNITY): Payer: Self-pay

## 2023-11-10 ENCOUNTER — Other Ambulatory Visit: Payer: Self-pay

## 2023-11-16 ENCOUNTER — Other Ambulatory Visit: Payer: Self-pay | Admitting: Internal Medicine

## 2023-11-17 ENCOUNTER — Other Ambulatory Visit (HOSPITAL_COMMUNITY): Payer: Self-pay

## 2023-11-17 ENCOUNTER — Other Ambulatory Visit: Payer: Self-pay

## 2023-11-17 ENCOUNTER — Telehealth: Payer: Self-pay

## 2023-11-17 MED ORDER — ALBUTEROL SULFATE HFA 108 (90 BASE) MCG/ACT IN AERS
2.0000 | INHALATION_SPRAY | RESPIRATORY_TRACT | 5 refills | Status: AC | PRN
  Filled 2023-11-17: qty 6.7, 17d supply, fill #0
  Filled 2023-12-10: qty 6.7, 17d supply, fill #1
  Filled 2024-01-20: qty 6.7, 17d supply, fill #2
  Filled 2024-05-22: qty 6.7, 17d supply, fill #3
  Filled 2024-06-13: qty 6.7, 17d supply, fill #4

## 2023-11-17 MED ORDER — AZELASTINE HCL 0.1 % NA SOLN
1.0000 | Freq: Every day | NASAL | 5 refills | Status: AC
  Filled 2023-11-17: qty 30, 90d supply, fill #0
  Filled 2023-12-10 – 2024-03-19 (×2): qty 30, 90d supply, fill #1
  Filled 2024-06-13: qty 30, 90d supply, fill #2

## 2023-11-17 MED ORDER — FLUTICASONE PROPIONATE 50 MCG/ACT NA SUSP
1.0000 | Freq: Every day | NASAL | 5 refills | Status: AC
  Filled 2023-11-17: qty 16, 60d supply, fill #0
  Filled 2023-12-10 – 2024-03-19 (×2): qty 16, 60d supply, fill #1
  Filled 2024-05-22: qty 16, 60d supply, fill #2
  Filled 2024-06-13: qty 16, 60d supply, fill #3

## 2023-11-17 MED ORDER — FLUTICASONE PROPIONATE 50 MCG/ACT NA SUSP
1.0000 | Freq: Every day | NASAL | 5 refills | Status: DC
  Filled 2023-11-17: qty 16, 30d supply, fill #0

## 2023-11-17 MED ORDER — AZELASTINE HCL 0.1 % NA SOLN
1.0000 | Freq: Every day | NASAL | 5 refills | Status: DC
  Filled 2023-11-17: qty 30, 200d supply, fill #0

## 2023-11-17 NOTE — Addendum Note (Signed)
 Addended by: Jewelia Bocchino on: 11/17/2023 03:52 PM   Modules accepted: Orders

## 2023-11-17 NOTE — Telephone Encounter (Signed)
 I sent in the meds separately.

## 2023-11-17 NOTE — Telephone Encounter (Signed)
*  Pulm  Pharmacy Patient Advocate Encounter   Received notification from CoverMyMeds that prior authorization for Azelastine -Fluticasone  137-50MCG/ACT suspension  is required/requested.   Insurance verification completed.   The patient is insured through [facility] .   Per test claim:  TRIAL OF FLUTICASONE  OR FLUNISOLIDE (NASAL FORMULATION) IN THE PAST 120 DAYS is preferred by the insurance.  If suggested medication is appropriate, Please send in a new RX and discontinue this one. If not, please advise as to why it's not appropriate so that we may request a Prior Authorization. Please note, some preferred medications may still require a PA.  If the suggested medications have not been trialed and there are no contraindications to their use, the PA will not be submitted, as it will not be approved.   CMM Key: [id number]

## 2023-11-18 ENCOUNTER — Other Ambulatory Visit: Payer: Self-pay

## 2023-12-10 ENCOUNTER — Other Ambulatory Visit (HOSPITAL_COMMUNITY): Payer: Self-pay

## 2023-12-12 ENCOUNTER — Other Ambulatory Visit: Payer: Self-pay

## 2023-12-23 ENCOUNTER — Emergency Department (HOSPITAL_BASED_OUTPATIENT_CLINIC_OR_DEPARTMENT_OTHER)
Admission: EM | Admit: 2023-12-23 | Discharge: 2023-12-23 | Disposition: A | Discharge status: Home / Self Care | Attending: Emergency Medicine | Admitting: Emergency Medicine

## 2023-12-23 DIAGNOSIS — I4892 Unspecified atrial flutter: Secondary | ICD-10-CM | POA: Diagnosis not present

## 2023-12-23 DIAGNOSIS — R000 Tachycardia, unspecified: Secondary | ICD-10-CM | POA: Diagnosis present

## 2023-12-23 DIAGNOSIS — I483 Typical atrial flutter: Secondary | ICD-10-CM | POA: Diagnosis not present

## 2023-12-23 LAB — COMPREHENSIVE METABOLIC PANEL WITH GFR
ALT: 15 U/L (ref 0–44)
AST: 20 U/L (ref 15–41)
Albumin: 4.8 g/dL (ref 3.5–5.0)
Alkaline Phosphatase: 94 U/L (ref 38–126)
Anion gap: 15 (ref 5–15)
BUN: 20 mg/dL (ref 6–20)
CO2: 22 mmol/L (ref 22–32)
Calcium: 10.2 mg/dL (ref 8.9–10.3)
Chloride: 102 mmol/L (ref 98–111)
Creatinine, Ser: 1.06 mg/dL — ABNORMAL HIGH (ref 0.44–1.00)
GFR, Estimated: >60 mL/min
Glucose, Bld: 93 mg/dL (ref 70–99)
Potassium: 4.7 mmol/L (ref 3.5–5.1)
Sodium: 139 mmol/L (ref 135–145)
Total Bilirubin: 0.5 mg/dL (ref 0.0–1.2)
Total Protein: 8.2 g/dL — ABNORMAL HIGH (ref 6.5–8.1)

## 2023-12-23 LAB — URINALYSIS, ROUTINE W REFLEX MICROSCOPIC
Bacteria, UA: NONE SEEN
Bilirubin Urine: NEGATIVE
Glucose, UA: NEGATIVE mg/dL
Hgb urine dipstick: NEGATIVE
Ketones, ur: NEGATIVE mg/dL
Nitrite: NEGATIVE
Protein, ur: NEGATIVE mg/dL
Specific Gravity, Urine: 1.01 (ref 1.005–1.030)
pH: 6.5 (ref 5.0–8.0)

## 2023-12-23 LAB — CBC
HCT: 40.9 % (ref 36.0–46.0)
Hemoglobin: 13.9 g/dL (ref 12.0–15.0)
MCH: 31.1 pg (ref 26.0–34.0)
MCHC: 34 g/dL (ref 30.0–36.0)
MCV: 91.5 fL (ref 80.0–100.0)
Platelets: 423 10*3/uL — ABNORMAL HIGH (ref 150–400)
RBC: 4.47 MIL/uL (ref 3.87–5.11)
RDW: 12.8 % (ref 11.5–15.5)
WBC: 9.5 10*3/uL (ref 4.0–10.5)
nRBC: 0 % (ref 0.0–0.2)

## 2023-12-23 LAB — CBG MONITORING, ED: Glucose-Capillary: 94 mg/dL (ref 70–99)

## 2023-12-23 LAB — TROPONIN T, HIGH SENSITIVITY: Troponin T High Sensitivity: <15 ng/L

## 2023-12-23 MED ORDER — SODIUM CHLORIDE 0.9 % IV BOLUS
500.0000 mL | Freq: Once | INTRAVENOUS | Status: AC
  Administered 2023-12-23: 500 mL via INTRAVENOUS

## 2023-12-23 MED ORDER — METOPROLOL TARTRATE 25 MG PO TABS: 50.0000 mg | ORAL_TABLET | Freq: Two times a day (BID) | ORAL | 0 refills | Status: AC

## 2023-12-23 MED ORDER — METOPROLOL TARTRATE 25 MG PO TABS
50.0000 mg | ORAL_TABLET | Freq: Two times a day (BID) | ORAL | 0 refills | Status: DC
  Filled 2023-12-23: qty 10, 3d supply, fill #0

## 2023-12-23 MED ORDER — METOPROLOL TARTRATE 5 MG/5ML IV SOLN
5.0000 mg | Freq: Once | INTRAVENOUS | Status: AC
  Administered 2023-12-23: 5 mg via INTRAVENOUS
  Filled 2023-12-23: qty 5

## 2023-12-23 NOTE — ED Notes (Signed)
 ED Provider at bedside.

## 2023-12-23 NOTE — Discharge Instructions (Addendum)
 You were seen today for atrial flutter.  Your lab work and exam were reassuring today.  You converted yourself out of atrial flutter back into normal rhythm today.  However recommend you continue to monitor your symptoms at home and follow-up with cardiology.  We have prescribed a referral today for you to follow-up with.  They should reach out to you within the next 24 to 48 hours to schedule an appointment.  If they do not, I will reach out to their office and call to schedule an appointment.  I am also prescribing you metoprolol which is a medication we gave you today.  This is an immediate release medication which you can use as needed if you recognize that you are still having a fast heart rate.  Take 1 of these medications and wait 30 minutes and if still tachycardic, return to the ED.  Otherwise you can follow-up with cardiology.

## 2023-12-23 NOTE — ED Provider Notes (Signed)
 [place] [facility]ERGENC[facility] DEPARTMENT AT [facility] Provider Note   CSN: [id number] Arrival date & time: 12/23/23  2003     Patient presents with: No chief complaint on file.   Kathy Ramsey is a 58 y.o. female.  HPI Patient is a 58 year old female to the ED today with concerns for tachycardia that she noticed while she was at work today.  No previous heart history.  States that she noticed that when she felt lightheaded when standing and had a portable ECG where she noted that she had tachycardia and came to the ED after.  Said that she has had 2 cups of coffee which is less than her normal.  Notes that her feeling of lightheadedness has gone away and that she is currently asymptomatic outside of the tachycardia.  Denies fever, headache, vision changes, chest pain, shortness of breath, abdominal pain, nausea, vomiting, dysuria, lower leg swelling.    Prior to Admission medications   Medication Sig Start Date End Date Taking? Authorizing Provider  metoprolol tartrate (LOPRESSOR) 25 MG tablet Take 2 tablets (50 mg total) by mouth 2 (two) times daily. 12/23/23  Yes Chanon Loney S, PA-C  albuterol  (PROVENTIL ) (2.5 MG/3ML) 0.083% nebulizer solution Inhale 3 mLs (2.5 mg total) by nebulization every 6 (six) hours as needed for wheezing or shortness of breath. 10/29/21   Meade Verdon RAMAN, MD  albuterol  (VENTOLIN  HFA) 108 [phone] Base) MCG/ACT inhaler Inhale 2 puffs into the lungs every 4 (four) to 6 (six) hours as needed. 11/17/23   Meade Verdon RAMAN, MD  azelastine  (ASTELIN ) 0.1 % nasal spray Place 1 spray into both nostrils daily as directed 11/17/23   Meade Verdon RAMAN, MD  buPROPion  (WELLBUTRIN  SR) 150 MG 12 hr tablet Take 1 tablet (150 mg total) by mouth in the morning. 06/27/23     FLUoxetine  (PROZAC ) 40 MG capsule Take 1 capsule (40 mg total) by mouth daily. 06/27/23     fluticasone  (FLONASE ) 50 MCG/ACT nasal spray Place 1 spray into both nostrils daily. 11/17/23   Desai, Nikita S, MD   fluticasone -salmeterol (ADVAIR  DISKUS) 250-50 MCG/ACT AEPB Inhale 1 puff into the lungs in the morning and at bedtime. 09/01/23   Desai, Nikita S, MD  hydrocortisone  (ANUSOL -HC) 25 MG suppository Place 1 suppository (25 mg total) rectally 2 (two) times daily. 11/30/20   Raspet, Erin K, PA-C  levocetirizine (XYZAL ) 5 MG tablet Take 1 tablet (5 mg total) by mouth every evening. 09/01/23   Desai, Nikita S, MD  montelukast  (SINGULAIR ) 10 MG tablet Take 1 tablet (10 mg total) by mouth at bedtime. 09/01/23   Desai, Nikita S, MD    Allergies: Patient has no known allergies.    Review of Systems  Cardiovascular:  Positive for palpitations.  All other systems reviewed and are negative.   Updated Vital Signs BP 113/84   Pulse 65   Temp 98.5 F (36.9 C) (Oral)   Resp 17   SpO2 97%   Physical Exam Vitals and nursing note reviewed.  Constitutional:      General: She is not in acute distress.    Appearance: Normal appearance. She is not ill-appearing.     Comments: Smiling and jovial during exam  HENT:     Head: Normocephalic and atraumatic.  Eyes:     General:        Right eye: No discharge.        Left eye: No discharge.     Extraocular Movements: Extraocular movements intact.  Conjunctiva/sclera: Conjunctivae normal.  Cardiovascular:     Rate and Rhythm: Regular rhythm. Tachycardia present.     Pulses: Normal pulses.     Heart sounds: Normal heart sounds. No murmur heard.    No friction rub. No gallop.  Pulmonary:     Effort: Pulmonary effort is normal. No respiratory distress.     Breath sounds: Normal breath sounds. No stridor. No wheezing, rhonchi or rales.  Chest:     Chest wall: No tenderness.  Abdominal:     General: Abdomen is flat. There is no distension.     Palpations: Abdomen is soft.     Tenderness: There is no abdominal tenderness. There is no guarding or rebound.  Musculoskeletal:     Cervical back: Normal range of motion and neck supple. No rigidity.     Right  lower leg: No edema.     Left lower leg: No edema.  Skin:    General: Skin is warm and dry.     Findings: No bruising, lesion or rash.  Neurological:     General: No focal deficit present.     Mental Status: She is alert. Mental status is at baseline.     Sensory: No sensory deficit.     Motor: No weakness.     Coordination: Coordination normal.     Gait: Gait normal.  Psychiatric:        Mood and Affect: Mood normal.     (all labs ordered are listed, but only abnormal results are displayed) Labs Reviewed  COMPREHENSIVE METABOLIC PANEL WITH GFR - Abnormal; Notable for the following components:      Result Value   Creatinine, Ser 1.06 (*)    Total Protein 8.2 (*)    All other components within normal limits  CBC - Abnormal; Notable for the following components:   Platelets 423 (*)    All other components within normal limits  URINALYSIS, ROUTINE W REFLEX MICROSCOPIC - Abnormal; Notable for the following components:   Leukocytes,Ua SMALL (*)    All other components within normal limits  CBG MONITORING, ED  TROPONIN T, HIGH SENSITIVITY    EKG: EKG Interpretation Date/Time:  Friday December 23 2023 20:12:50 EDT Ventricular Rate:  141 PR Interval:  120 QRS Duration:  152 QT Interval:  342 QTC Calculation: 523 R Axis:   76  Text Interpretation: Atrial flutter Non-specific intra-ventricular conduction block Abnormal ECG No previous ECGs available Confirmed by Darra Chew [phone]) on 12/23/2023 8:17:04 PM  Radiology: No results found.  Procedures   Medications Ordered in the ED  metoprolol tartrate (LOPRESSOR) injection 5 mg (5 mg Intravenous Given 12/23/23 2146)  sodium chloride  0.9 % bolus 500 mL (0 mLs Intravenous Stopped 12/23/23 2251)    Medical Decision Making Amount and/or Complexity of Data Reviewed Labs: ordered.  Risk Prescription drug management.   This patient is a 58 year old female who presents to the ED for concern of tachycardia, noting that she had  initially felt some lightheadedness when symptoms first presented when she was standing however has since felt better and is no longer experiencing any other symptoms outside of the tachycardia.  No previous history of heart disease or heart history.  Does have extensive family history of heart electrical problems .   On physical exam, patient is in no acute distress, afebrile, alert and orient x 4, speaking in full sentences, nontachypneic.  Jovial and smiling but noticeably tachycardic.  Heart rate of 130s to 140s.  LCTAB, no murmurs.  No  nontender to palpation, no lower leg edema.  Unremarkable exam otherwise.  Attending was discussing with patient about possible cardioversion when patient converted herself into sinus rhythm.  Provided her with metoprolol and observed her for extended time here in the emergency department.  She remained in sinus rhythm throughout the whole course after her conversion.  With patient still remaining in sinus rhythm, will send home with metoprolol tartrate to use as needed as needed if she notices that she is experiencing tachycardia.  Will also have her follow-up with cardiology for further evaluation.  CHA2DS2-VASc 2 score of 1.  Patient vital signs have remained stable throughout the course of patient's time in the ED. Low suspicion for any other emergent pathology at this time. I believe this patient is safe to be discharged. Provided strict return to ER precautions. Patient expressed agreement and understanding of plan. All questions were answered.  Differential diagnoses prior to evaluation: The emergent differential diagnosis includes, but is not limited to, arrhythmia, PE, ACS, pneumonia, drug intoxication, metabolic disturbance. This is not an exhaustive differential.   Past Medical History / Co-morbidities / Social History: Asthma  Additional history: Chart reviewed. Pertinent results include: Seen for asthma but has no previous medical history of any  heart disease or heart history.  Lab Tests/Imaging studies: I personally interpreted labs/imaging and the pertinent results include:   CBC unremarkable CMP notes a mildly elevated creatinine but otherwise unremarkable UA unremarkable Troponin negative  Cardiac monitoring: EKG obtained and interpreted by myself and attending physician which shows: Atrial flutter  EKG Interpretation Date/Time:  Friday December 23 2023 20:12:50 EDT Ventricular Rate:  141 PR Interval:  120 QRS Duration:  152 QT Interval:  342 QTC Calculation: 523 R Axis:   76  Text Interpretation: Atrial flutter Non-specific intra-ventricular conduction block Abnormal ECG No previous ECGs available Confirmed by Darra Chew [phone]) on 12/23/2023 8:17:04 PM      With repeat EKG showing patient returned to sinus rhythm.    Medications: I ordered medication including metoprolol.  I have reviewed the patients home medicines and have made adjustments as needed.  Critical Interventions: None  Social Determinants of Health: Does not follow-up with a cardiologist at this time.  Disposition: After consideration of the diagnostic results and the patients response to treatment, I feel that the patient would benefit from discharge intern as above.   emergency department workup does not suggest an emergent condition requiring admission or immediate intervention beyond what has been performed at this time. The plan is: Follow-up with cardiology, metoprolol titrate as needed for any repeat tachycardia episodes, return to ER for new or worsening symptoms.. The patient is safe for discharge and has been instructed to return immediately for worsening symptoms, change in symptoms or any other concerns.    Final diagnoses:  Typical atrial flutter [facility])    ED Discharge Orders          Ordered    Ambulatory referral to Cardiology       Comments: If you have not heard from the Cardiology office within the next 72 hours please call  [phone].   12/23/23 2155    metoprolol tartrate (LOPRESSOR) 25 MG tablet  2 times daily        12/23/23 2311               Taeshaun Rames S, PA-C 12/23/23 2326    Long, Joshua G, MD 12/23/23 2328

## 2023-12-23 NOTE — ED Triage Notes (Signed)
 PT complains of dizziness while standing, pt tried a portable heart monitor which said A. Fibrillation.   Pt denies chest pain, feels small tightness on chest, otherwise alert and oriented.  Hx of Asthma, denies previous Afib.

## 2023-12-23 NOTE — ED Notes (Signed)
Reviewed discharge instructions, medications, and home care with pt. Pt verbalized understanding and had no further questions. Pt exited ED without complications.

## 2023-12-24 ENCOUNTER — Other Ambulatory Visit (HOSPITAL_COMMUNITY): Payer: Self-pay

## 2023-12-24 ENCOUNTER — Other Ambulatory Visit: Payer: Self-pay

## 2024-01-20 ENCOUNTER — Other Ambulatory Visit: Payer: Self-pay

## 2024-01-20 ENCOUNTER — Other Ambulatory Visit (HOSPITAL_COMMUNITY): Payer: Self-pay

## 2024-01-23 ENCOUNTER — Encounter: Payer: Self-pay | Admitting: Cardiovascular Disease

## 2024-01-23 ENCOUNTER — Other Ambulatory Visit: Payer: Self-pay

## 2024-01-23 ENCOUNTER — Ambulatory Visit: Attending: Cardiovascular Disease | Admitting: Cardiovascular Disease

## 2024-01-23 ENCOUNTER — Other Ambulatory Visit (HOSPITAL_COMMUNITY): Payer: Self-pay

## 2024-01-23 VITALS — BP 132/86 | HR 84 | Ht 68.5 in | Wt 164.0 lb

## 2024-01-23 DIAGNOSIS — Z7189 Other specified counseling: Secondary | ICD-10-CM

## 2024-01-23 DIAGNOSIS — I483 Typical atrial flutter: Secondary | ICD-10-CM

## 2024-01-23 MED ORDER — ASPIRIN 81 MG PO TBEC: 81.0000 mg | DELAYED_RELEASE_TABLET | Freq: Every day | ORAL | Status: AC

## 2024-01-23 MED ORDER — BUPROPION HCL ER (SR) 150 MG PO TB12
150.0000 mg | ORAL_TABLET | Freq: Every morning | ORAL | 0 refills | Status: DC
  Filled 2024-01-23: qty 90, 90d supply, fill #0

## 2024-01-23 NOTE — Progress Notes (Signed)
 Chief Complaint  Patient presents with   New Patient (Initial Visit)    Atrial flutter    History of Present Illness: 58 yo female with history of former tobacco abuse, asthma and recent diagnosis of atrial flutter who is here today as a new consult, referred by Dr. Darra, for the evaluation of atrial flutter. She was seen in the [facility] ED on 12/23/23 with palpitations and dizziness and was found to be in atrial flutter. She converted to sinus spontaneously. She was discharged home with metoprolol  to use as needed. CHADS VASC score 1. Anti-coagulation was not started. She has a history of remote tobacco use. She stopped smoking in 2005. She tells me today that she passed out briefly when she was in atrial flutter. She has no exertional chest pain or dyspnea.   Primary Care Physician: Katina Pfeiffer, PA-C   Past Medical History:  Diagnosis Date   Asthma    adult onset   Atrial flutter [facility])     Past Surgical History:  Procedure Laterality Date   CESAREAN SECTION     COLONOSCOPY     ESOPHAGOGASTRODUODENOSCOPY     MOUTH SURGERY     wisdom teeth    Current Outpatient Medications  Medication Sig Dispense Refill   albuterol  (PROVENTIL ) (2.5 MG/3ML) 0.083% nebulizer solution Inhale 3 mLs (2.5 mg total) by nebulization every 6 (six) hours as needed for wheezing or shortness of breath. 75 mL 5   albuterol  (VENTOLIN  HFA) 108 (90 Base) MCG/ACT inhaler Inhale 2 puffs into the lungs every 4 (four) to 6 (six) hours as needed. 6.7 g 5   aspirin  EC 81 MG tablet Take 1 tablet (81 mg total) by mouth daily. Swallow whole.     azelastine  (ASTELIN ) 0.1 % nasal spray Place 1 spray into both nostrils daily as directed 30 mL 5   buPROPion  (WELLBUTRIN  SR) 150 MG 12 hr tablet Take 1 tablet (150 mg total) by mouth in the morning. 90 tablet 1   FLUoxetine  (PROZAC ) 40 MG capsule Take 1 capsule (40 mg total) by mouth daily. 90 capsule 2   fluticasone  (FLONASE ) 50 MCG/ACT nasal spray Place 1 spray into  both nostrils daily. 16 g 5   fluticasone -salmeterol (ADVAIR  DISKUS) 250-50 MCG/ACT AEPB Inhale 1 puff into the lungs in the morning and at bedtime. 60 each 11   hydrocortisone  (ANUSOL -HC) 25 MG suppository Place 1 suppository (25 mg total) rectally 2 (two) times daily. 12 suppository 0   levocetirizine (XYZAL ) 5 MG tablet Take 1 tablet (5 mg total) by mouth every evening. 30 tablet 11   metoprolol  tartrate (LOPRESSOR ) 25 MG tablet Take 2 tablets (50 mg total) by mouth 2 (two) times daily. 10 tablet 0   montelukast  (SINGULAIR ) 10 MG tablet Take 1 tablet (10 mg total) by mouth at bedtime. 30 tablet 11   No current facility-administered medications for this visit.    No Known Allergies  Social History   Socioeconomic History   Marital status: Married    Spouse name: Not on file   Number of children: 1   Years of education: Not on file   Highest education level: Not on file  Occupational History   Occupation: Works in Education officer, environmental at [employer]  Tobacco Use   Smoking status: Former    Current packs/day: 0.00    Average packs/day: 1.5 packs/day for 22.0 years (33.0 ttl pk-yrs)    Types: Cigarettes    Start date: 39    Quit date: 2005  Years since quitting: 20.6   Smokeless tobacco: Never  Vaping Use   Vaping status: Never Used  Substance and Sexual Activity   Alcohol use: Yes    Alcohol/week: 6.0 standard drinks of alcohol    Types: 6 Cans of beer per week    Comment: social   Drug use: Not Currently   Sexual activity: Yes    Birth control/protection: Post-menopausal  Other Topics Concern   Not on file  Social History Narrative   Not on file   Social Drivers of Health   Financial Resource Strain: Not on file  Food Insecurity: Not on file  Transportation Needs: Not on file  Physical Activity: Not on file  Stress: Not on file  Social Connections: Not on file  Intimate Partner Violence: Not on file    Family History  Problem Relation Age of Onset   Asthma Mother    COPD  Mother    Leukemia Mother    Stomach cancer Mother        duodenal   CAD Father    Heart failure Father    Asthma Child    Colon cancer Neg Hx    Diabetes Neg Hx    Rectal cancer Neg Hx     Review of Systems:  As stated in the HPI and otherwise negative.   BP 132/86   Pulse 84   Ht 5' 8.5 (1.74 m)   Wt 164 lb (74.4 kg)   SpO2 99%   BMI 24.57 kg/m   Physical Examination: General: Well developed, well nourished, NAD  HEENT: OP clear, mucus membranes moist  SKIN: warm, dry. No rashes. Neuro: No focal deficits  Musculoskeletal: Muscle strength 5/5 all ext  Psychiatric: Mood and affect normal  Neck: No JVD, no carotid bruits, no thyromegaly, no lymphadenopathy.  Lungs:Clear bilaterally, no wheezes, rhonci, crackles Cardiovascular: Regular rate and rhythm. No murmurs, gallops or rubs. Abdomen:Soft. Bowel sounds present. Non-tender.  Extremities: No lower extremity edema. Pulses are 2 + in the bilateral DP/PT.  EKG:  EKG is ordered today. The ekg ordered today demonstrates  EKG Interpretation Date/Time:  Monday January 23 2024 11:38:13 EDT Ventricular Rate:  86 PR Interval:  118 QRS Duration:  80 QT Interval:  364 QTC Calculation: 435 R Axis:   81  Text Interpretation: Normal sinus rhythm Normal ECG Confirmed by Verlin Bruckner [phone]) on 01/23/2024 11:40:13 AM    Recent Labs: 12/23/2023: ALT 15; BUN 20; Creatinine, Ser 1.06; Hemoglobin 13.9; Platelets 423; Potassium 4.7; Sodium 139   Lipid Panel No results found for: CHOL, TRIG, HDL, CHOLHDL, VLDL, LDLCALC, LDLDIRECT   Wt Readings from Last 3 Encounters:  01/23/24 164 lb (74.4 kg)  09/01/23 169 lb (76.7 kg)  08/02/22 164 lb 6.4 oz (74.6 kg)    Assessment and Plan:   1. Atrial flutter, paroxysmal: Sinus today. One episode of atrial flutter. CHADS VASC score 1. Anti-coagulation is not indicated with an isolated episode of atrial flutter. Agree with metoprolol  as needed. Will arrange an echo to  assess LV function and exclude structural heart disease. If she has more recurrence of atrial flutter, would schedule daily Toprol  or Cardizem.   2. Cardiac risk screening: Self pay CT calcium score   Labs/ tests ordered today include:   Orders Placed This Encounter  Procedures   CT CARDIAC SCORING (SELF PAY ONLY)   EKG 12-Lead   ECHOCARDIOGRAM COMPLETE   Disposition:   F/U with me in one year.    Signed, Bruckner Verlin,  MD, [facility] 01/23/2024 12:06 PM    [facility] [address] [place], [place], [place]  [address] Phone: [phone]; Fax: [phone]

## 2024-01-23 NOTE — Patient Instructions (Signed)
 Medication Instructions:  Your physician has recommended you make the following change in your medication:  1.) start aspirin  81 mg - take one tablet daily (E.C. -enteric coated, not chewable)  *If you need a refill on your cardiac medications before your next appointment, please call your pharmacy*  Lab Work: none   Testing/Procedures: Your physician has requested that you have an echocardiogram. Echocardiography is a painless test that uses sound waves to create images of your heart. It provides your doctor with information about the size and shape of your heart and how well your heart's chambers and valves are working. This procedure takes approximately one hour. There are no restrictions for this procedure. Please do NOT wear cologne, perfume, aftershave, or lotions (deodorant is allowed). Please arrive 15 minutes prior to your appointment time.  Please note: We ask at that you not bring children with you during ultrasound (echo/ vascular) testing. Due to room size and safety concerns, children are not allowed in the ultrasound rooms during exams. Our front office staff cannot provide observation of children in our lobby area while testing is being conducted. An adult accompanying a patient to their appointment will only be allowed in the ultrasound room at the discretion of the ultrasound technician under special circumstances. We apologize for any inconvenience.  Calcium Score CT scan - out of pocket charge $95  Follow-Up: At [facility], you and your health needs are our priority.  As part of our continuing mission to provide you with exceptional heart care, our providers are all part of one team.  This team includes your primary Cardiologist (physician) and Advanced Practice Providers or APPs (Physician Assistants and Nurse Practitioners) who all work together to provide you with the care you need, when you need it.  Your next appointment:   12 month(s)  Provider:    Lonni Cash, MD

## 2024-01-25 ENCOUNTER — Other Ambulatory Visit (HOSPITAL_COMMUNITY): Payer: Self-pay

## 2024-01-25 DIAGNOSIS — F329 Major depressive disorder, single episode, unspecified: Secondary | ICD-10-CM | POA: Diagnosis not present

## 2024-01-25 DIAGNOSIS — I4892 Unspecified atrial flutter: Secondary | ICD-10-CM | POA: Diagnosis not present

## 2024-01-25 DIAGNOSIS — J4541 Moderate persistent asthma with (acute) exacerbation: Secondary | ICD-10-CM | POA: Diagnosis not present

## 2024-01-25 DIAGNOSIS — Z0000 Encounter for general adult medical examination without abnormal findings: Secondary | ICD-10-CM | POA: Diagnosis not present

## 2024-01-25 DIAGNOSIS — Z23 Encounter for immunization: Secondary | ICD-10-CM | POA: Diagnosis not present

## 2024-01-25 MED ORDER — FLUOXETINE HCL 40 MG PO CAPS
40.0000 mg | ORAL_CAPSULE | Freq: Every day | ORAL | 2 refills | Status: AC
  Filled 2024-06-13: qty 90, 90d supply, fill #0

## 2024-02-07 DIAGNOSIS — H524 Presbyopia: Secondary | ICD-10-CM | POA: Diagnosis not present

## 2024-02-22 ENCOUNTER — Ambulatory Visit (HOSPITAL_COMMUNITY)
Admission: RE | Admit: 2024-02-22 | Discharge: 2024-02-22 | Disposition: A | Discharge status: Home / Self Care | Source: Ambulatory Visit | Attending: Cardiovascular Disease | Admitting: Cardiovascular Disease

## 2024-02-22 ENCOUNTER — Ambulatory Visit: Payer: Self-pay | Admitting: Cardiovascular Disease

## 2024-02-22 ENCOUNTER — Other Ambulatory Visit (HOSPITAL_COMMUNITY)

## 2024-02-22 ENCOUNTER — Ambulatory Visit (HOSPITAL_COMMUNITY)
Admission: RE | Admit: 2024-02-22 | Discharge: 2024-02-22 | Disposition: A | Discharge status: Home / Self Care | Payer: Self-pay | Source: Ambulatory Visit | Attending: Cardiovascular Disease | Admitting: Cardiovascular Disease

## 2024-02-22 DIAGNOSIS — I483 Typical atrial flutter: Secondary | ICD-10-CM | POA: Diagnosis not present

## 2024-02-22 DIAGNOSIS — R918 Other nonspecific abnormal finding of lung field: Secondary | ICD-10-CM | POA: Diagnosis not present

## 2024-02-22 DIAGNOSIS — Z7189 Other specified counseling: Secondary | ICD-10-CM | POA: Insufficient documentation

## 2024-02-22 LAB — ECHOCARDIOGRAM COMPLETE
AR max vel: 3.24 cm2
AV Area VTI: 3.28 cm2
AV Area mean vel: 3.09 cm2
AV Mean grad: 2 mmHg
AV Peak grad: 4.2 mmHg
Ao pk vel: 1.03 m/s
Area-P 1/2: 3.63 cm2
S' Lateral: 3.2 cm

## 2024-03-07 ENCOUNTER — Telehealth: Admitting: Physician Assistant

## 2024-03-07 DIAGNOSIS — T148XXA Other injury of unspecified body region, initial encounter: Secondary | ICD-10-CM

## 2024-03-07 DIAGNOSIS — W5911XA Bitten by nonvenomous snake, initial encounter: Secondary | ICD-10-CM | POA: Diagnosis not present

## 2024-03-07 NOTE — Progress Notes (Signed)
   We are sorry that you have had an injury. Here is how we plan to help!  Based on what you shared with me it looks like you have a simple puncture wounds that do not need to be repaired with stitches or tissue glue. I reviewed your chart and see you have had a Tetanus (TDaP) booster in the past 5 years (2022) so nothing that needs to be updated now. We do not routinely recommend antibiotic prophylaxis in non-venomous snake bites, and thankfully the wound does not look actively infection. Please follow the home care below.   HOME CARE: Clean the cut or scrape - Wash it well with soap and water. * avoid using hydrogen peroxide which may cause tissue damage, or impede wound healing.  Stop the bleeding - If your cut or scrape is bleeding, press a clean cloth or bandage firmly on the area for 20 minutes. You can also help slow the bleeding by holding the cut above the level of your heart.   Put a thin layer of Bacitracin antibiotic ointment on the cut or scrape. (this can be purchased at any local pharmacy- ask your pharmacist if you need assistance)   Cover the cut or scrape with a bandage or gauze. Keep the bandage clean and dry. Change the bandage 1 to 2 times every day until your cut or scrape heals.   Watch for signs that your cut or scrape is infected (redness, drainage, pain, warmth, swelling or fever)  Over the next 48 hours your wound should start to improve with less pain, less swelling and less redness. If you should develop increasing pain, swelling, redness, fever, pus from the wound you should be seen immediately to make sure this is not becoming infected.   WOUND CARE: Please keep a layer of antibiotic ointment (bacitracin preferred) on this wound at least twice a day for the next seven days and keep a sterile dressing over top of it. You may gently clean the wound with warm soap and water between dressing changes.  We strongly recommend that you have a medical provider reevaluate  your wound within 2 to 3 days in person to make sure that it is healing appropriately.  Thank you for choosing an e-visit.  Your e-visit answers were reviewed by a board certified advanced clinical practitioner to complete your personal care plan. Depending upon the condition, your plan could have included both over the counter or prescription medications.  Please review your pharmacy choice. Make sure the pharmacy is open so you can pick up prescription now. If there is a problem, you may contact your provider through [employer] and have the prescription routed to another pharmacy.  Your safety is important to us . If you have drug allergies check your prescription carefully.   For the next 24 hours you can use MyChart to ask questions about today's visit, request a non-urgent call back, or ask for a work or school excuse. You will get an email in the next two days asking about your experience. I hope that your e-visit has been valuable and will speed your recovery.  I have spent 5 minutes in review of e-visit questionnaire, review and updating patient chart, medical decision making and response to patient.   Elsie Velma Lunger, PA-C

## 2024-03-20 ENCOUNTER — Other Ambulatory Visit (HOSPITAL_COMMUNITY): Payer: Self-pay

## 2024-05-22 ENCOUNTER — Other Ambulatory Visit: Payer: Self-pay

## 2024-06-13 ENCOUNTER — Other Ambulatory Visit: Payer: Self-pay | Admitting: Internal Medicine

## 2024-06-13 ENCOUNTER — Other Ambulatory Visit: Payer: Self-pay

## 2024-06-13 ENCOUNTER — Other Ambulatory Visit (HOSPITAL_COMMUNITY): Payer: Self-pay

## 2024-06-14 ENCOUNTER — Other Ambulatory Visit (HOSPITAL_COMMUNITY): Payer: Self-pay

## 2024-06-14 ENCOUNTER — Other Ambulatory Visit: Payer: Self-pay

## 2024-06-14 MED ORDER — BUPROPION HCL ER (SR) 150 MG PO TB12
150.0000 mg | ORAL_TABLET | Freq: Every day | ORAL | 1 refills | Status: AC
  Filled 2024-06-14: qty 90, 90d supply, fill #0

## 2024-06-18 ENCOUNTER — Other Ambulatory Visit: Payer: Self-pay

## 2024-06-18 ENCOUNTER — Other Ambulatory Visit (HOSPITAL_COMMUNITY): Payer: Self-pay

## 2024-06-18 MED ORDER — ALBUTEROL SULFATE (2.5 MG/3ML) 0.083% IN NEBU
2.5000 mg | INHALATION_SOLUTION | Freq: Four times a day (QID) | RESPIRATORY_TRACT | 5 refills | Status: AC | PRN
  Filled 2024-06-18: qty 75, 7d supply, fill #0

## 2024-06-19 ENCOUNTER — Other Ambulatory Visit: Payer: Self-pay
